# Patient Record
Sex: Female | Born: 1985 | Race: Black or African American | Hispanic: No | Marital: Single | Smoking: Former smoker
Health system: Southern US, Community
[De-identification: ages and names within clinical notes are randomized; demographics above are authoritative.]

## PROBLEM LIST (undated history)

## (undated) ENCOUNTER — Inpatient Hospital Stay (HOSPITAL_COMMUNITY): Payer: Self-pay

## (undated) ENCOUNTER — Inpatient Hospital Stay (HOSPITAL_COMMUNITY): Payer: Medicaid Other

## (undated) ENCOUNTER — Ambulatory Visit: Payer: Self-pay

## (undated) DIAGNOSIS — IMO0002 Reserved for concepts with insufficient information to code with codable children: Secondary | ICD-10-CM

## (undated) DIAGNOSIS — N83209 Unspecified ovarian cyst, unspecified side: Secondary | ICD-10-CM

## (undated) DIAGNOSIS — R87619 Unspecified abnormal cytological findings in specimens from cervix uteri: Secondary | ICD-10-CM

## (undated) DIAGNOSIS — F419 Anxiety disorder, unspecified: Secondary | ICD-10-CM

## (undated) DIAGNOSIS — B999 Unspecified infectious disease: Secondary | ICD-10-CM

## (undated) DIAGNOSIS — F4310 Post-traumatic stress disorder, unspecified: Secondary | ICD-10-CM

## (undated) DIAGNOSIS — F319 Bipolar disorder, unspecified: Secondary | ICD-10-CM

## (undated) HISTORY — PX: NO PAST SURGERIES: SHX2092

---
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
## 2008-06-07 ENCOUNTER — Emergency Department (HOSPITAL_COMMUNITY): Admission: EM | Admit: 2008-06-07 | Discharge: 2008-06-07 | Payer: Self-pay | Admitting: Emergency Medicine

## 2009-08-06 ENCOUNTER — Emergency Department (HOSPITAL_COMMUNITY): Admission: EM | Admit: 2009-08-06 | Discharge: 2009-08-06 | Payer: Self-pay | Admitting: Emergency Medicine

## 2009-08-12 ENCOUNTER — Emergency Department (HOSPITAL_COMMUNITY): Admission: EM | Admit: 2009-08-12 | Discharge: 2009-08-12 | Payer: Self-pay | Admitting: Emergency Medicine

## 2009-08-16 ENCOUNTER — Inpatient Hospital Stay (HOSPITAL_COMMUNITY): Admission: AD | Admit: 2009-08-16 | Discharge: 2009-08-17 | Payer: Self-pay | Admitting: Obstetrics and Gynecology

## 2009-08-16 ENCOUNTER — Ambulatory Visit: Payer: Self-pay | Admitting: Physician Assistant

## 2009-09-28 ENCOUNTER — Inpatient Hospital Stay (HOSPITAL_COMMUNITY): Admission: AD | Admit: 2009-09-28 | Discharge: 2009-09-28 | Payer: Self-pay | Admitting: Obstetrics & Gynecology

## 2009-10-16 ENCOUNTER — Inpatient Hospital Stay (HOSPITAL_COMMUNITY): Admission: AD | Admit: 2009-10-16 | Discharge: 2009-10-16 | Payer: Self-pay | Admitting: Obstetrics & Gynecology

## 2009-11-19 ENCOUNTER — Inpatient Hospital Stay (HOSPITAL_COMMUNITY): Admission: AD | Admit: 2009-11-19 | Discharge: 2009-11-19 | Payer: Self-pay | Admitting: Obstetrics & Gynecology

## 2010-01-17 ENCOUNTER — Inpatient Hospital Stay (HOSPITAL_COMMUNITY): Admission: AD | Admit: 2010-01-17 | Discharge: 2010-01-19 | Payer: Self-pay | Admitting: Obstetrics

## 2010-03-19 ENCOUNTER — Inpatient Hospital Stay (HOSPITAL_COMMUNITY): Admission: AD | Admit: 2010-03-19 | Discharge: 2010-03-19 | Payer: Self-pay | Admitting: Obstetrics

## 2010-03-20 ENCOUNTER — Inpatient Hospital Stay (HOSPITAL_COMMUNITY): Admission: AD | Admit: 2010-03-20 | Discharge: 2010-03-22 | Payer: Self-pay | Admitting: Obstetrics

## 2010-07-27 LAB — CBC
HCT: 36.8 % (ref 36.0–46.0)
MCH: 33.2 pg (ref 26.0–34.0)
MCH: 33.5 pg (ref 26.0–34.0)
MCHC: 35.1 g/dL (ref 30.0–36.0)
MCV: 94.4 fL (ref 78.0–100.0)
Platelets: 140 10*3/uL — ABNORMAL LOW (ref 150–400)
RBC: 3.05 MIL/uL — ABNORMAL LOW (ref 3.87–5.11)
RDW: 13.6 % (ref 11.5–15.5)
WBC: 9.7 10*3/uL (ref 4.0–10.5)

## 2010-07-29 LAB — URINALYSIS, ROUTINE W REFLEX MICROSCOPIC
Bilirubin Urine: NEGATIVE
Hgb urine dipstick: NEGATIVE
Nitrite: NEGATIVE
Specific Gravity, Urine: 1.02 (ref 1.005–1.030)

## 2010-07-29 LAB — WET PREP, GENITAL
Trich, Wet Prep: NONE SEEN
Yeast Wet Prep HPF POC: NONE SEEN

## 2010-07-29 LAB — URINE CULTURE: Special Requests: NEGATIVE

## 2010-07-29 LAB — GC/CHLAMYDIA PROBE AMP, GENITAL: Chlamydia, DNA Probe: NEGATIVE

## 2010-08-01 LAB — URINALYSIS, ROUTINE W REFLEX MICROSCOPIC
Bilirubin Urine: NEGATIVE
Glucose, UA: NEGATIVE mg/dL
Hgb urine dipstick: NEGATIVE
Nitrite: NEGATIVE
Protein, ur: NEGATIVE mg/dL
Specific Gravity, Urine: 1.02 (ref 1.005–1.030)
pH: 7 (ref 5.0–8.0)

## 2010-08-01 LAB — URINE CULTURE: Colony Count: 60000

## 2010-08-01 LAB — URINE MICROSCOPIC-ADD ON

## 2010-08-02 LAB — CBC
HCT: 34.4 % — ABNORMAL LOW (ref 36.0–46.0)
MCHC: 35.6 g/dL (ref 30.0–36.0)
MCV: 94.4 fL (ref 78.0–100.0)
Platelets: 155 10*3/uL (ref 150–400)
RDW: 14 % (ref 11.5–15.5)
WBC: 9.8 10*3/uL (ref 4.0–10.5)

## 2010-08-02 LAB — URINALYSIS, ROUTINE W REFLEX MICROSCOPIC
Bilirubin Urine: NEGATIVE
Glucose, UA: NEGATIVE mg/dL
Ketones, ur: NEGATIVE mg/dL
Ketones, ur: NEGATIVE mg/dL
Protein, ur: NEGATIVE mg/dL
Protein, ur: NEGATIVE mg/dL
Specific Gravity, Urine: 1.015 (ref 1.005–1.030)

## 2010-08-02 LAB — WET PREP, GENITAL
Clue Cells Wet Prep HPF POC: NONE SEEN
Trich, Wet Prep: NONE SEEN
Yeast Wet Prep HPF POC: NONE SEEN

## 2010-08-02 LAB — URINE CULTURE: Colony Count: NO GROWTH

## 2010-08-02 LAB — GC/CHLAMYDIA PROBE AMP, GENITAL
Chlamydia, DNA Probe: NEGATIVE
GC Probe Amp, Genital: NEGATIVE
GC Probe Amp, Genital: NEGATIVE

## 2010-08-02 LAB — URINE MICROSCOPIC-ADD ON

## 2010-08-04 LAB — URINE MICROSCOPIC-ADD ON

## 2010-08-04 LAB — URINALYSIS, ROUTINE W REFLEX MICROSCOPIC
Glucose, UA: NEGATIVE mg/dL
Protein, ur: 100 mg/dL — AB
pH: 6 (ref 5.0–8.0)

## 2010-08-06 LAB — URINALYSIS, ROUTINE W REFLEX MICROSCOPIC
Glucose, UA: NEGATIVE mg/dL
Nitrite: NEGATIVE
Specific Gravity, Urine: 1.023 (ref 1.005–1.030)
pH: 7.5 (ref 5.0–8.0)

## 2010-08-06 LAB — CBC
Hemoglobin: 12.8 g/dL (ref 12.0–15.0)
MCHC: 34.3 g/dL (ref 30.0–36.0)
MCV: 95 fL (ref 78.0–100.0)
RBC: 3.93 MIL/uL (ref 3.87–5.11)
WBC: 8.6 10*3/uL (ref 4.0–10.5)

## 2010-08-06 LAB — DIFFERENTIAL
Basophils Relative: 0 % (ref 0–1)
Eosinophils Absolute: 0.1 10*3/uL (ref 0.0–0.7)
Lymphs Abs: 1.8 10*3/uL (ref 0.7–4.0)
Monocytes Absolute: 0.6 10*3/uL (ref 0.1–1.0)
Monocytes Relative: 7 % (ref 3–12)
Neutro Abs: 6 10*3/uL (ref 1.7–7.7)
Neutrophils Relative %: 71 % (ref 43–77)

## 2010-08-06 LAB — POCT I-STAT, CHEM 8
BUN: 7 mg/dL (ref 6–23)
Creatinine, Ser: 0.8 mg/dL (ref 0.4–1.2)
Glucose, Bld: 77 mg/dL (ref 70–99)
Hemoglobin: 12.9 g/dL (ref 12.0–15.0)
Potassium: 4.2 mEq/L (ref 3.5–5.1)
Sodium: 138 mEq/L (ref 135–145)

## 2010-08-06 LAB — GC/CHLAMYDIA PROBE AMP, GENITAL
Chlamydia, DNA Probe: NEGATIVE
GC Probe Amp, Genital: NEGATIVE

## 2010-08-06 LAB — WET PREP, GENITAL

## 2010-08-06 LAB — PREGNANCY, URINE: Preg Test, Ur: POSITIVE

## 2010-08-06 LAB — URINE MICROSCOPIC-ADD ON

## 2010-08-06 LAB — POCT PREGNANCY, URINE: Preg Test, Ur: POSITIVE

## 2010-08-06 LAB — URINE CULTURE: Colony Count: NO GROWTH

## 2010-08-06 LAB — ABO/RH: ABO/RH(D): B POS

## 2010-08-30 LAB — COMPREHENSIVE METABOLIC PANEL
ALT: 17 U/L (ref 0–35)
CO2: 26 mEq/L (ref 19–32)
Calcium: 9.4 mg/dL (ref 8.4–10.5)
Chloride: 106 mEq/L (ref 96–112)
Creatinine, Ser: 0.87 mg/dL (ref 0.4–1.2)
GFR calc non Af Amer: >60 mL/min (ref >60)
Glucose, Bld: 86 mg/dL (ref 70–99)
Sodium: 139 mEq/L (ref 135–145)
Total Bilirubin: 0.8 mg/dL (ref 0.3–1.2)

## 2010-08-30 LAB — POCT PREGNANCY, URINE: Preg Test, Ur: NEGATIVE

## 2010-08-30 LAB — RAPID URINE DRUG SCREEN, HOSP PERFORMED
Barbiturates: NOT DETECTED
Benzodiazepines: NOT DETECTED
Cocaine: NOT DETECTED
Opiates: NOT DETECTED

## 2010-08-30 LAB — DIFFERENTIAL
Basophils Absolute: 0 10*3/uL (ref 0.0–0.1)
Eosinophils Absolute: 0.2 10*3/uL (ref 0.0–0.7)
Lymphs Abs: 1.7 10*3/uL (ref 0.7–4.0)
Neutrophils Relative %: 73 % (ref 43–77)

## 2010-08-30 LAB — URINALYSIS, ROUTINE W REFLEX MICROSCOPIC
Bilirubin Urine: NEGATIVE
Hgb urine dipstick: NEGATIVE
Ketones, ur: NEGATIVE mg/dL
Nitrite: NEGATIVE
Protein, ur: NEGATIVE mg/dL
Urobilinogen, UA: 1 mg/dL (ref 0.0–1.0)

## 2010-08-30 LAB — CBC
MCHC: 33.8 g/dL (ref 30.0–36.0)
RBC: 4.78 MIL/uL (ref 3.87–5.11)

## 2010-08-30 LAB — GC/CHLAMYDIA PROBE AMP, GENITAL
Chlamydia, DNA Probe: NEGATIVE
GC Probe Amp, Genital: NEGATIVE

## 2010-08-30 LAB — WET PREP, GENITAL: Clue Cells Wet Prep HPF POC: NONE SEEN

## 2010-08-30 LAB — TRICYCLICS SCREEN, URINE: TCA Scrn: NOT DETECTED

## 2010-08-30 LAB — URINE CULTURE

## 2010-08-30 LAB — ETHANOL: Alcohol, Ethyl (B): <5 mg/dL (ref 0–10)

## 2010-09-15 ENCOUNTER — Inpatient Hospital Stay (INDEPENDENT_AMBULATORY_CARE_PROVIDER_SITE_OTHER)
Admission: RE | Admit: 2010-09-15 | Discharge: 2010-09-15 | Disposition: A | Discharge status: Home / Self Care | Payer: Medicaid Other | Source: Ambulatory Visit | Attending: Family Medicine | Admitting: Family Medicine

## 2010-09-15 DIAGNOSIS — N949 Unspecified condition associated with female genital organs and menstrual cycle: Secondary | ICD-10-CM

## 2010-09-15 DIAGNOSIS — N912 Amenorrhea, unspecified: Secondary | ICD-10-CM

## 2010-09-15 LAB — POCT URINALYSIS DIP (DEVICE)
Bilirubin Urine: NEGATIVE
Hgb urine dipstick: NEGATIVE
Nitrite: NEGATIVE
Protein, ur: NEGATIVE mg/dL
Urobilinogen, UA: 0.2 mg/dL (ref 0.0–1.0)
pH: 5.5 (ref 5.0–8.0)

## 2011-05-17 NOTE — L&D Delivery Note (Signed)
Delivery Note At 12:09 AM a viable female was delivered via Vaginal, Spontaneous Delivery (Presentation: Middle Occiput Anterior).  APGAR: 8, 9; weight 6 lb 4.7 oz (2855 g).   Placenta status: , Manual removal.  Cord:  with the following complications: None.  Cord pH: none  Anesthesia: Epidural  Episiotomy: None Lacerations: None Suture Repair: none Est. Blood Loss (mL): 500  Mom to postpartum.  Baby to nursery-stable.  HARPER,CHARLES A 04/15/2012, 12:57 AM

## 2011-09-05 ENCOUNTER — Inpatient Hospital Stay (HOSPITAL_COMMUNITY): Payer: Medicaid Other

## 2011-09-05 ENCOUNTER — Inpatient Hospital Stay (HOSPITAL_COMMUNITY)
Admission: AD | Admit: 2011-09-05 | Discharge: 2011-09-05 | Disposition: A | Discharge status: Home / Self Care | Payer: Medicaid Other | Source: Ambulatory Visit | Attending: Obstetrics and Gynecology | Admitting: Obstetrics and Gynecology

## 2011-09-05 ENCOUNTER — Encounter (HOSPITAL_COMMUNITY): Payer: Self-pay | Admitting: *Deleted

## 2011-09-05 DIAGNOSIS — O209 Hemorrhage in early pregnancy, unspecified: Secondary | ICD-10-CM | POA: Insufficient documentation

## 2011-09-05 DIAGNOSIS — O4690 Antepartum hemorrhage, unspecified, unspecified trimester: Secondary | ICD-10-CM

## 2011-09-05 LAB — HCG, QUANTITATIVE, PREGNANCY: hCG, Beta Chain, Quant, S: 111785 m[IU]/mL — ABNORMAL HIGH (ref <5)

## 2011-09-05 LAB — WET PREP, GENITAL: Yeast Wet Prep HPF POC: NONE SEEN

## 2011-09-05 NOTE — MAU Provider Note (Signed)
History     CSN: [id number]  Arrival date and time: 09/05/11 1730   None     Chief Complaint  Patient presents with  . Vaginal Bleeding   HPI Meghan Salazar is 26 y.o. G2P1001 [redacted]w[redacted]d weeks presenting with report of pink on the tissue when she wiped today at work.  LMP 06/23/11-normal for her.   Plans care with Dr. Gaynell Face when [facility] card comes.  Denies abdominal pain but does have mild cramps. Hx of ovarian cysts.  Had bleeding with first pregnancy.      Past Medical History  Diagnosis Date  . Asthma     No past surgical history on file.  Family History  Problem Relation Age of Onset  . Diabetes Maternal Grandmother   . Hypertension Paternal Grandmother     History  Substance Use Topics  . Smoking status: Former Games developer  . Smokeless tobacco: Not on file  . Alcohol Use: No    Allergies: No Known Allergies  Prescriptions prior to admission  Medication Sig Dispense Refill  . albuterol (PROVENTIL) (2.5 MG/3ML) 0.083% nebulizer solution Take 2.5 mg by nebulization every 6 (six) hours as needed. For asthma      . Prenatal Vit-Fe Fumarate-FA (PRENATAL MULTIVITAMIN) TABS Take 1 tablet by mouth every morning.        Review of Systems  Gastrointestinal: Positive for abdominal pain (cramping). Negative for nausea and vomiting.  Genitourinary:       + for vaginal spotting   Physical Exam   Blood pressure 128/75, pulse 62, temperature 98 F (36.7 C), temperature source Oral, resp. rate 18, height 5\' 7"  (1.702 m), weight 81.647 kg (180 lb), last menstrual period 06/23/2011.  Physical Exam  Constitutional: She is oriented to person, place, and time. She appears well-developed and well-nourished. No distress.  HENT:  Head: Normocephalic.  Cardiovascular: Normal rate.   Respiratory: Effort normal.  Genitourinary: Uterus is enlarged. Uterus is not tender. Right adnexum displays no mass, no tenderness and no fullness. Left adnexum displays no mass, no tenderness and no  fullness. There is bleeding (small amount of pink chalky appearing discharge with mild odor.  No clots seen) around the vagina.  Neurological: She is alert and oriented to person, place, and time.  Skin: Skin is warm and dry.  Psychiatric: She has a normal mood and affect. Her behavior is normal.   BLOOD TYPE FROM PREVIOUS RECORD IS B+  Results for orders placed during the hospital encounter of 09/05/11 (from the past 24 hour(s))  POCT PREGNANCY, URINE     Status: Abnormal   Collection Time   09/05/11  5:54 PM      Component Value Range   Preg Test, Ur POSITIVE (*) NEGATIVE   WET PREP, GENITAL     Status: Abnormal   Collection Time   09/05/11  6:50 PM      Component Value Range   Yeast Wet Prep HPF POC NONE SEEN  NONE SEEN    Trich, Wet Prep NONE SEEN  NONE SEEN    Clue Cells Wet Prep HPF POC FEW (*) NONE SEEN    WBC, Wet Prep HPF POC MANY (*) NONE SEEN   HCG, QUANTITATIVE, PREGNANCY     Status: Abnormal   Collection Time   09/05/11  7:00 PM      Component Value Range   hCG, Beta Chain, Quant, S 409811 (*) <5 (mIU/mL)   ULTRASOUND REPORT:  IUGS with YS, FP, and Cardiac activity of 149.  Right CLC, no FF [redacted]w[redacted]d with EDD 04/18/12.   MAU Course  Procedures GC/CHL culture to lab  MDM   Assessment and Plan  A:  Bleeding in early pregnancy      Viable intrauterine pregnancy at [redacted]w[redacted]d  P:  Pelvic rest      Begin prenatal care with the doctor of your choice.   Varnika Butz,EVE M 09/05/2011, 6:34 PM

## 2011-09-05 NOTE — MAU Note (Signed)
Pt is unsure of dates of last period; dopplered for a FHR but unable to hear one; c/o pinkish discharge on tissue when she wipes;c/o slight cramping;

## 2011-09-05 NOTE — Discharge Instructions (Signed)
Pelvic Rest Pelvic rest is sometimes recommended for women when:   The placenta is partially or completely covering the opening of the cervix (placenta previa).   There is bleeding between the uterine wall and the amniotic sac in the first trimester (subchorionic hemorrhage).   The cervix begins to open without labor starting (incompetent cervix, cervical insufficiency).   The labor is too early (preterm labor).  HOME CARE INSTRUCTIONS  Do not have sexual intercourse, stimulation, or an orgasm.   Do not use tampons, douche, or put anything in the vagina.   Do not lift anything over 10 pounds (4.5 kg).   Avoid strenuous activity or straining your pelvic muscles.  SEEK MEDICAL CARE IF:  You have any vaginal bleeding during pregnancy. Treat this as a potential emergency.   You have cramping pain felt low in the stomach (stronger than menstrual cramps).   You notice vaginal discharge (watery, mucus, or bloody).   You have a low, dull backache.   There are regular contractions or uterine tightening.  SEEK IMMEDIATE MEDICAL CARE IF: You have vaginal bleeding and have placenta previa.  Document Released: 08/27/2010 Document Revised: 04/21/2011 Document Reviewed: 08/27/2010 [facility] Patient Information 2012 [place], [place].Vaginal Bleeding During Pregnancy, Second Trimester A small amount of bleeding (spotting) is relatively common in pregnancy. It usually stops on its own. There are many causes for bleeding or spotting in pregnancy. Some bleeding may be related to the pregnancy and some may not. Cramping with the bleeding is more serious and concerning. Tell your caregiver if you have any vaginal bleeding.  CAUSES   Infection, inflammation or growths on the cervix.   The placenta may partially or completely be covering the opening of the cervix inside the uterus.   The placenta may have separated from the uterus.   You may be having early/preterm labor.   The cervix is not  strong enough to keep a baby inside the uterus (cervical insufficiency).   Many tiny cysts in the uterus instead of pregnancy tissue (molar pregnancy)  SYMPTOMS   Vaginal spotting or bleeding with or without cramps.   Uterine contractions.   Abnormal vaginal discharge.   You may have spotting or spotting after having sexual intercourse.  DIAGNOSIS  To evaluate the pregnancy, your caregiver may:  Do a pelvic exam.   Take blood tests.   Do an ultrasound.  It is very important to follow your caregiver's instructions.  TREATMENT   Evaluation of the pregnancy with blood tests and ultrasound.   Bed rest (getting up to use the bathroom only).   Rho-gam immunization if the mother is Rh negative and the father is Rh positive.   If you are having uterine contractions, you may be given medication to stop the contractions.   If you have cervical insufficiency, you may have a suture placed in the cervix to close it.  HOME CARE INSTRUCTIONS   If your caregiver orders bed rest, you may need to make arrangements for the care of other children and for any other responsibilities. However, your caregiver may allow you to continue light activity.   Keep track of the number of pads you use each day and how soaked (saturated) they are. Write this down.   Do not use tampons. Do not douche.   Do not have sexual intercourse or orgasms until approved by your physician.   Save any tissue that you pass for your caregiver to see.   Take medicine for cramps only with your caregiver's permission.  Do not take aspirin because it can make you bleed.   Do not exercise, do any strenuous activities or heavy lifting without your caregiver's permission.  SEEK IMMEDIATE MEDICAL CARE IF:   You experience severe cramps in your stomach, back or belly (abdomen).   You have uterine contractions.   You have an oral temperature above 102 F (38.9 C), not controlled by medicine.   You develop chills.    You pass large clots or tissue.   Your bleeding increases or you become light-headed, weak or have fainting episodes.   You have leaking or a gush of fluid from your vagina.  Document Released: 02/09/2005 Document Revised: 04/21/2011 Document Reviewed: 08/21/2008 [employer] Patient Information Vaginal Bleeding During Pregnancy A small amount of bleeding from the vagina can happen anytime during pregnancy. Be sure to tell your doctor about all vaginal bleeding.  HOME CARE  Get plenty of rest and sleep.   Count the number of pads you use each day. Do not use tampons.   Save any tissue you pass for your doctor to see.   Do not exercise   Do not do any heavy lifting.   Avoid going up and down stairs. If you must climb stairs, go slowly.   Do not have sex (intercourse) or orgasms until approved by your doctor.   Do not douche.   Only take medicine as told by your doctor. Do not take aspirin.   Eat healthy.   Always keep your follow-up appointments.  GET HELP RIGHT AWAY IF:   You feel the baby moving less or not moving at all.   The bleeding gets worse.   You have very painful cramps or pain in your stomach or back.   You pass large clots or anything that looks like tissue.   You have a temperature by mouth above 102 F (38.9 C).   You feel very weak.   You have chills.   You feel dizzy or pass out (faint).   You have a gush of fluid from the vagina.  MAKE SURE YOU:   Understand these instructions.   Will watch your condition.   Will get help right away if you are not doing well or get worse.  Document Released: 02/09/2008 Document Revised: 04/21/2011 Document Reviewed: 04/07/2009 [facility] Patient Information 2012 [place], [place].Vaginal Bleeding During Pregnancy A small amount of bleeding from the vagina can happen anytime during pregnancy. Be sure to tell your doctor about all vaginal bleeding.  HOME CARE  Get plenty of rest and sleep.   Count the  number of pads you use each day. Do not use tampons.   Save any tissue you pass for your doctor to see.   Do not exercise   Do not do any heavy lifting.   Avoid going up and down stairs. If you must climb stairs, go slowly.   Do not have sex (intercourse) or orgasms until approved by your doctor.   Do not douche.   Only take medicine as told by your doctor. Do not take aspirin.   Eat healthy.   Always keep your follow-up appointments.  GET HELP RIGHT AWAY IF:   You feel the baby moving less or not moving at all.   The bleeding gets worse.   You have very painful cramps or pain in your stomach or back.   You pass large clots or anything that looks like tissue.   You have a temperature by mouth above 102 F (38.9 C).  You feel very weak.   You have chills.   You feel dizzy or pass out (faint).   You have a gush of fluid from the vagina.  MAKE SURE YOU:   Understand these instructions.   Will watch your condition.   Will get help right away if you are not doing well or get worse.  Document Released: 02/09/2008 Document Revised: 04/21/2011 Document Reviewed: 04/07/2009 [facility] Patient Information 2012 [place], [place].

## 2011-09-05 NOTE — MAU Note (Signed)
Pink noted when wiped.  Got scared.  Has been having cramps in lower abd.  preg test at  A few wks ago, confirmed at health dept- was told [redacted]w[redacted]d.

## 2011-09-06 LAB — GC/CHLAMYDIA PROBE AMP, GENITAL
Chlamydia, DNA Probe: NEGATIVE
GC Probe Amp, Genital: NEGATIVE

## 2011-09-06 NOTE — MAU Provider Note (Signed)
Agree with above note.  Meghan Salazar 09/06/2011 7:04 AM

## 2011-09-26 ENCOUNTER — Inpatient Hospital Stay (HOSPITAL_COMMUNITY)
Admission: AD | Admit: 2011-09-26 | Discharge: 2011-09-26 | Disposition: A | Discharge status: Hospice | Payer: Medicaid Other | Source: Ambulatory Visit | Attending: Obstetrics & Gynecology | Admitting: Obstetrics & Gynecology

## 2011-09-26 ENCOUNTER — Encounter (HOSPITAL_COMMUNITY): Payer: Self-pay | Admitting: *Deleted

## 2011-09-26 DIAGNOSIS — O210 Mild hyperemesis gravidarum: Secondary | ICD-10-CM | POA: Insufficient documentation

## 2011-09-26 DIAGNOSIS — O219 Vomiting of pregnancy, unspecified: Secondary | ICD-10-CM

## 2011-09-26 HISTORY — DX: Unspecified ovarian cyst, unspecified side: N83.209

## 2011-09-26 LAB — URINALYSIS, ROUTINE W REFLEX MICROSCOPIC
Ketones, ur: 15 mg/dL — AB
Leukocytes, UA: NEGATIVE
Protein, ur: NEGATIVE mg/dL
Urobilinogen, UA: 0.2 mg/dL (ref 0.0–1.0)

## 2011-09-26 LAB — URINE MICROSCOPIC-ADD ON

## 2011-09-26 MED ORDER — ONDANSETRON HCL 4 MG PO TABS
8.0000 mg | ORAL_TABLET | Freq: Once | ORAL | Status: AC
  Administered 2011-09-26: 8 mg via ORAL
  Filled 2011-09-26: qty 2

## 2011-09-26 MED ORDER — ONDANSETRON HCL 8 MG PO TABS: 8.0000 mg | ORAL_TABLET | Freq: Three times a day (TID) | ORAL | Status: AC | PRN

## 2011-09-26 NOTE — MAU Note (Signed)
Pt has been vomiting since early pregnancy, but says it has been getting worse over the past two days.  Denies any diarrhea or fever, dysuria, or bleeding.  States she  Cannot keep anything down.

## 2011-09-26 NOTE — MAU Provider Note (Signed)
History     CSN: [id number]  Arrival date and time: 09/26/11 1542   None     Chief Complaint  Patient presents with  . Hyperemesis Gravidarum   HPI  Pt is here with report of nausea and vomiting of pregnancy.  Reports unable to hold anything down for 24 hours.  Denies fever, body aches, or chill.  No vaginal bleeding or abnormal vaginal discharge.   Past Medical History  Diagnosis Date  . Asthma   . Other and unspecified ovarian cysts     History reviewed. No pertinent past surgical history.  Family History  Problem Relation Age of Onset  . Diabetes Maternal Grandmother   . Hypertension Paternal Grandmother     History  Substance Use Topics  . Smoking status: Former Smoker -- 0.2 packs/day    Types: Cigarettes    Quit date: 07/27/2011  . Smokeless tobacco: Not on file  . Alcohol Use: No    Allergies: No Known Allergies  Prescriptions prior to admission  Medication Sig Dispense Refill  . albuterol (PROVENTIL) (2.5 MG/3ML) 0.083% nebulizer solution Take 2.5 mg by nebulization every 6 (six) hours as needed. For asthma      . Prenatal Vit-Fe Fumarate-FA (PRENATAL MULTIVITAMIN) TABS Take 1 tablet by mouth every morning.        Review of Systems  Gastrointestinal: Positive for nausea and vomiting. Negative for abdominal pain.  All other systems reviewed and are negative.   Physical Exam   Blood pressure 118/66, pulse 52, temperature 97.5 F (36.4 C), temperature source Oral, resp. rate 16, height 5\' 7"  (1.702 m), weight 78.472 kg (173 lb), last menstrual period 06/23/2011, unknown if currently breastfeeding.  Physical Exam  Constitutional: She is oriented to person, place, and time. She appears well-developed and well-nourished. No distress.       Laughs during visit  HENT:  Head: Normocephalic.  Mouth/Throat: Mucous membranes are not dry.  Neck: Normal range of motion. Neck supple.  Cardiovascular: Normal rate, regular rhythm and normal heart sounds.     Respiratory: Effort normal and breath sounds normal.  GI: Soft. There is no tenderness.  Genitourinary: No bleeding around the vagina.  Musculoskeletal: Normal range of motion.  Neurological: She is alert and oriented to person, place, and time.  Skin: Skin is warm and dry.    MAU Course  Procedures  Results for orders placed during the hospital encounter of 09/26/11 (from the past 24 hour(s))  URINALYSIS, ROUTINE W REFLEX MICROSCOPIC     Status: Abnormal   Collection Time   09/26/11  3:55 PM      Component Value Range   Color, Urine YELLOW  YELLOW    APPearance CLOUDY (*) CLEAR    Specific Gravity, Urine >1.030 (*) 1.005 - 1.030    pH 6.0  5.0 - 8.0    Glucose, UA NEGATIVE  NEGATIVE (mg/dL)   Hgb urine dipstick TRACE (*) NEGATIVE    Bilirubin Urine NEGATIVE  NEGATIVE    Ketones, ur 15 (*) NEGATIVE (mg/dL)   Protein, ur NEGATIVE  NEGATIVE (mg/dL)   Urobilinogen, UA 0.2  0.0 - 1.0 (mg/dL)   Nitrite NEGATIVE  NEGATIVE    Leukocytes, UA NEGATIVE  NEGATIVE   URINE MICROSCOPIC-ADD ON     Status: Abnormal   Collection Time   09/26/11  3:55 PM      Component Value Range   Squamous Epithelial / LPF MANY (*) RARE    WBC, UA 3-6  <3 (WBC/hpf)  RBC / HPF 0-2  <3 (RBC/hpf)   Bacteria, UA FEW (*) RARE    Urine-Other MUCOUS PRESENT     PO Zofran > reports improvement in symptoms  Assessment and Plan  Vomiting in Pregnancy  Plan: DC to home RX Zofran Keep scheduled appointment with Dr. Gaynell Face  [facility] 09/26/2011, 4:27 PM

## 2011-09-27 NOTE — MAU Provider Note (Signed)
Attestation of Attending Supervision of Advanced Practitioner: Evaluation and management procedures were performed by the OB Fellow/PA/CNM/NP under my supervision and collaboration. Chart reviewed, and agree with management and plan.  Jennel Mara, M.D. 09/27/2011 9:34 AM   

## 2011-10-02 ENCOUNTER — Inpatient Hospital Stay (HOSPITAL_COMMUNITY)
Admission: AD | Admit: 2011-10-02 | Discharge: 2011-10-02 | Disposition: A | Discharge status: Home / Self Care | Payer: Medicaid Other | Source: Ambulatory Visit | Attending: Obstetrics and Gynecology | Admitting: Obstetrics and Gynecology

## 2011-10-02 DIAGNOSIS — O99891 Other specified diseases and conditions complicating pregnancy: Secondary | ICD-10-CM | POA: Insufficient documentation

## 2011-10-02 DIAGNOSIS — W108XXA Fall (on) (from) other stairs and steps, initial encounter: Secondary | ICD-10-CM | POA: Insufficient documentation

## 2011-10-02 DIAGNOSIS — S0510XA Contusion of eyeball and orbital tissues, unspecified eye, initial encounter: Secondary | ICD-10-CM

## 2011-10-02 DIAGNOSIS — Z331 Pregnant state, incidental: Secondary | ICD-10-CM

## 2011-10-02 DIAGNOSIS — S0592XA Unspecified injury of left eye and orbit, initial encounter: Secondary | ICD-10-CM

## 2011-10-02 DIAGNOSIS — S0010XA Contusion of unspecified eyelid and periocular area, initial encounter: Secondary | ICD-10-CM | POA: Insufficient documentation

## 2011-10-02 DIAGNOSIS — W19XXXA Unspecified fall, initial encounter: Secondary | ICD-10-CM

## 2011-10-02 LAB — URINALYSIS, ROUTINE W REFLEX MICROSCOPIC
Bilirubin Urine: NEGATIVE
Hgb urine dipstick: NEGATIVE
Nitrite: NEGATIVE
Specific Gravity, Urine: >1.03 — ABNORMAL HIGH (ref 1.005–1.030)
Urobilinogen, UA: 0.2 mg/dL (ref 0.0–1.0)
pH: 6 (ref 5.0–8.0)

## 2011-10-02 MED ORDER — CYCLOBENZAPRINE HCL 10 MG PO TABS: 10.0000 mg | ORAL_TABLET | Freq: Once | ORAL | Status: AC

## 2011-10-02 MED ORDER — CYCLOBENZAPRINE HCL 10 MG PO TABS
10.0000 mg | ORAL_TABLET | Freq: Once | ORAL | Status: AC
  Administered 2011-10-02: 10 mg via ORAL
  Filled 2011-10-02: qty 1

## 2011-10-02 NOTE — Discharge Instructions (Signed)
RESOURCE GUIDE  Dental Problems  Patients with Medicaid: [facility]                     [address]                                           Phone:  [phone]                                                  If unable to pay or uninsured, contact:  [facility] or [facility] to become qualified for the adult dental clinic.  Chronic Pain Problems Contact [facility]  [phone] Patients need to be referred by their primary care doctor.  Insufficient Money for Medicine Contact [employer] Way:  call "211" or [facility] 271-5999.  No Primary Care Doctor Call [facility]  832-8000 Other agencies that provide inexpensive medical care    [facility]  [phone]    [facility]  [phone]    [facility]  [phone]    [facility]  [phone]    [facility]  [phone]    [facility]  [phone]  Substance Abuse Resources [facility]  336-882-2125 [facility] 336-784-9470 [facility] 336-285-9073 [facility] 336-845-3988 Residential & Outpatient Substance Abuse Program  800-659-3381  Psychological Services [facility]  832-9600 [facility]  [phone] [facility]   800 853-5163 (emergency services 641-4993)  Abuse/Neglect [place] Child Abuse Hotline (336) 641-3795 [place] Child Abuse Hotline 800-378-5315 (After Hours)  Emergency Shelter [facility] (336) 271-5985  [facility] (336) 275-9566 [facility] (704) 372-4663  MRSA Hotline #:   832-7006    [place] [facility]  [facility] [facility]  [employer]                           [facility] [address] [place]                     [address]         [address]  [place]                                                [place]                              [place] Phone:  [phone]                                  Phone:  [phone]                   Phone:  [phone]  [facility] Phone:  342-8316  [place] Child Abuse Hotline (336) 342-1394 (336)   409-8119 (After Hours)If You Are the Victim of Domestic Violence THE POLICE CAN HELP YOU: Get to a safe place away from the violence.  Get information on how the court can help protect you against the violence.  Get medical care for injuries you or your children may have.  Get necessary belongings from your home for you and your children.  Get copies of police reports about the violence.  File a complaint in criminal court.  Find where local criminal and family courts are located.  THE COURTS CAN HELP YOU If the person who harmed or threatened you is a family member or someone you have had a child with, then you have the right to take your case to the criminal courts, the [employer], or both.  If you and the abuser are not related, were not ever married, and do not have a child in common, then your case can be heard only in the criminal court.  The forms you need are available from the [facility] and the criminal court.  The courts can decide to provide a temporary order of protection for:  You.  Your children.  Any witnesses who may request one.  The [employer] may appoint a lawyer to help you in court if it is found that you cannot afford one.  The Family Court may order temporary child support and temporary custody of your children.  LAWS VARY FROM STATE TO STATE. YOU WILL NEED TO CHECK THE LAWS IN YOUR STATE. You may request that the law enforcement officer assist in:  Providing for your safety and that of your children. This includes providing information on how to obtain a temporary order of protection.  Obtaining essential personal property.  Locating and taking you and your children to a  safe place within the officer's jurisdiction. This includes but is not limited to a domestic violence program, a family member's or a friend's residence, or a similar place of safety.  Obtaining medical treatment for you and your children.  When the officer's jurisdiction is more than a single county, you may ask the officer to take you or make arrangements to take you and your children to a place of safety in the county where the incident occurred.  You may request a copy of any incident reports at no cost from the law enforcement agency.  You have the right to seek legal counsel of your own choosing. If you proceed in family court and if it is determined that you cannot afford an attorney one must be appointed to represent you without cost to you.  You may ask the district attorney or a [employer] to file a criminal complaint. You also have the right to have your petition and request for an order of protection filed on the same day you appear in court. Such request must be heard that same day or the next day court is in session.  Either court may issue an order of protection from conduct constituting a family offense. This could include an order for the respondent or defendant to stay away from you and your children.  If the family court is not in session, you may seek immediate assistance from the criminal court in obtaining an order of protection. The forms you need to obtain an order of protection are available from the family court and the local criminal court. Note that filing a criminal complaint or a family court petition containing allegations (claims) that are knowingly false is  a crime.  Call your local domestic violence program for additional information and support. Document Released: 07/23/2003 Document Revised: 04/21/2011 Document Reviewed: 03/12/2007 [facility] Patient Information 2012 [place], [place].Eye Contusion A black eye can happen when the eye area is hit with force.  Bleeding can happen under the skin of the eyelids. This causes the lids to puff up (swell) and look red and then black. This is often called a "black eye." If the injury involves only the lids and the surface of the eye, the black area will turn greenish and then yellowish in color before going away. The swelling may also go under the clear membrane that covers the white part of the eyeball making it very thick and red. If the black eye only involves the lids and tissues around the eye, the injured area will get better within a few days to weeks. However, black eyes can be serious and affect the eyeball and sight. CAUSES   Blunt injury or trauma to the face or eye area.   A forehead injury when blood under the skin works its way down to the eyelids.   Rubbing the eyes due to irritation (like allergies).   Blood clotting diseases.   Other serious eye or eye socket (orbit) diseases (such as a tumor, blood clot, or infection behind the eye).  SYMPTOMS   Swelling and redness of the eyelids.   Eyelid area turns to a black color.   Tenderness or soreness of the eyelids.  DIAGNOSIS  A diagnosis is usually based upon a thorough examination of the eye and surrounding area. The eye must be looked at carefully to be sure that it is not injured or that nothing else can threaten vision. An X-ray or CT scan may be needed to determine if there were any associated injuries, such as fractures. HOME CARE INSTRUCTIONS   If it is determined that there is no injury to the eye, you may continue normal activities.   Sunglasses may be worn to protect your eyes from bright light if this is uncomfortable.   Apply ice to the injury for 15 to 20 minutes, 3 to 4 times per day for swelling during the first 24 to 48 hours. To do this:   Put the ice in a plastic bag.   Place a towel between the bag of ice and your skin.   Sleep with your head high (elevated). You can put an extra pillow under your head. This may  help with discomfort.   Only take over-the-counter or prescription medicines for pain, discomfort, or fever as directed by your caregiver.   Do not use aspirin as this can increase bleeding.  If there is an injury to the eye, treatment will be determined by the nature of the injury. SEEK IMMEDIATE MEDICAL CARE IF:   You have any form of visual loss in the eye on the side of the injury.   You have double vision. You see 2 of everything (diplopia).   You feel sick to your stomach, dizzy, sleepy, or faint.   You develop any discharge from the eye or your nose.   The swelling and discoloration does not begin to go away in a few days.  MAKE SURE YOU:   Understand these instructions.   Will watch your condition.   Will get help right away if you are not doing well or get worse.  Document Released: 04/29/2000 Document Revised: 04/21/2011 Document Reviewed: 03/18/2011 [facility] Patient Information 2012 [place], [place].
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
## 2011-10-03 NOTE — MAU Provider Note (Signed)
Agree with above note.  Meghan Salazar 10/03/2011 6:32 AM

## 2011-10-31 ENCOUNTER — Other Ambulatory Visit (HOSPITAL_COMMUNITY): Payer: Self-pay | Admitting: Obstetrics

## 2011-10-31 DIAGNOSIS — O26879 Cervical shortening, unspecified trimester: Secondary | ICD-10-CM

## 2011-10-31 DIAGNOSIS — Z3689 Encounter for other specified antenatal screening: Secondary | ICD-10-CM

## 2011-11-01 ENCOUNTER — Other Ambulatory Visit (HOSPITAL_COMMUNITY): Payer: Self-pay | Admitting: Obstetrics

## 2011-11-01 DIAGNOSIS — O26879 Cervical shortening, unspecified trimester: Secondary | ICD-10-CM

## 2011-11-01 DIAGNOSIS — Z3689 Encounter for other specified antenatal screening: Secondary | ICD-10-CM

## 2011-11-02 ENCOUNTER — Encounter (HOSPITAL_COMMUNITY): Payer: Self-pay

## 2011-11-02 ENCOUNTER — Ambulatory Visit (HOSPITAL_COMMUNITY)
Admission: RE | Admit: 2011-11-02 | Discharge: 2011-11-02 | Disposition: A | Discharge status: Home / Self Care | Payer: Medicaid Other | Source: Ambulatory Visit | Attending: Obstetrics | Admitting: Obstetrics

## 2011-11-02 VITALS — BP 117/41 | HR 56 | Wt 176.2 lb

## 2011-11-02 DIAGNOSIS — O09299 Supervision of pregnancy with other poor reproductive or obstetric history, unspecified trimester: Secondary | ICD-10-CM | POA: Insufficient documentation

## 2011-11-02 DIAGNOSIS — Z3689 Encounter for other specified antenatal screening: Secondary | ICD-10-CM | POA: Insufficient documentation

## 2011-11-02 DIAGNOSIS — O26879 Cervical shortening, unspecified trimester: Secondary | ICD-10-CM

## 2011-11-02 NOTE — Progress Notes (Signed)
Patient seen today  for ultrasound appointment.  See full report in [facility].  Alpha Gula, MD  Single IUP at 16 0/7 weeks Hx of short cervix in previous delivery The cerivical length today is 3.3 cm without funneling or dynamic changes. Normal amniotic fluid volume  Recommend follow up ultrasound in 2-3 weeks for anatomy and cervical length.

## 2011-11-16 ENCOUNTER — Other Ambulatory Visit (HOSPITAL_COMMUNITY): Payer: Self-pay

## 2011-11-23 ENCOUNTER — Ambulatory Visit (HOSPITAL_COMMUNITY): Payer: Medicaid Other

## 2011-12-02 ENCOUNTER — Ambulatory Visit (HOSPITAL_COMMUNITY): Admission: RE | Admit: 2011-12-02 | Payer: Medicaid Other | Source: Ambulatory Visit

## 2012-01-06 LAB — OB RESULTS CONSOLE ANTIBODY SCREEN: Antibody Screen: NEGATIVE

## 2012-01-06 LAB — OB RESULTS CONSOLE HEPATITIS B SURFACE ANTIGEN: Hepatitis B Surface Ag: NEGATIVE

## 2012-01-12 ENCOUNTER — Encounter (HOSPITAL_COMMUNITY): Payer: Self-pay | Admitting: *Deleted

## 2012-01-12 ENCOUNTER — Inpatient Hospital Stay (HOSPITAL_COMMUNITY)
Admission: AD | Admit: 2012-01-12 | Discharge: 2012-01-12 | Disposition: A | Discharge status: Home / Self Care | Payer: Medicaid Other | Source: Ambulatory Visit | Attending: Obstetrics | Admitting: Obstetrics

## 2012-01-12 DIAGNOSIS — M5380 Other specified dorsopathies, site unspecified: Secondary | ICD-10-CM

## 2012-01-12 DIAGNOSIS — R1031 Right lower quadrant pain: Secondary | ICD-10-CM | POA: Insufficient documentation

## 2012-01-12 DIAGNOSIS — N949 Unspecified condition associated with female genital organs and menstrual cycle: Secondary | ICD-10-CM

## 2012-01-12 DIAGNOSIS — O2390 Unspecified genitourinary tract infection in pregnancy, unspecified trimester: Secondary | ICD-10-CM | POA: Insufficient documentation

## 2012-01-12 DIAGNOSIS — B9689 Other specified bacterial agents as the cause of diseases classified elsewhere: Secondary | ICD-10-CM | POA: Insufficient documentation

## 2012-01-12 DIAGNOSIS — N760 Acute vaginitis: Secondary | ICD-10-CM | POA: Insufficient documentation

## 2012-01-12 DIAGNOSIS — M62838 Other muscle spasm: Secondary | ICD-10-CM | POA: Insufficient documentation

## 2012-01-12 DIAGNOSIS — A499 Bacterial infection, unspecified: Secondary | ICD-10-CM | POA: Insufficient documentation

## 2012-01-12 DIAGNOSIS — M62830 Muscle spasm of back: Secondary | ICD-10-CM

## 2012-01-12 LAB — URINALYSIS, ROUTINE W REFLEX MICROSCOPIC
Glucose, UA: NEGATIVE mg/dL
pH: 8.5 — ABNORMAL HIGH (ref 5.0–8.0)

## 2012-01-12 LAB — WET PREP, GENITAL
Trich, Wet Prep: NONE SEEN
Yeast Wet Prep HPF POC: NONE SEEN

## 2012-01-12 MED ORDER — CYCLOBENZAPRINE HCL 10 MG PO TABS
10.0000 mg | ORAL_TABLET | Freq: Once | ORAL | Status: AC
  Administered 2012-01-12: 10 mg via ORAL
  Filled 2012-01-12: qty 1

## 2012-01-12 MED ORDER — METRONIDAZOLE 500 MG PO TABS: 500.0000 mg | ORAL_TABLET | Freq: Two times a day (BID) | ORAL | Status: AC

## 2012-01-12 MED ORDER — CYCLOBENZAPRINE HCL 10 MG PO TABS: 10.0000 mg | ORAL_TABLET | Freq: Three times a day (TID) | ORAL | Status: AC | PRN

## 2012-01-12 NOTE — MAU Note (Signed)
Pt came in by EMS. Was at bus stop started having sharp shooting pain in RLQ after carrying son across the street.

## 2012-01-12 NOTE — MAU Provider Note (Signed)
History     CSN: [id number]  Arrival date and time: 01/12/12 1141   First Provider Initiated Contact with Patient 01/12/12 1203      Chief Complaint  Patient presents with  . Abdominal Pain   HPI 26 y.o. G2P1001 at [redacted]w[redacted]d with RLQ pain, started when carrying son to bus stop, sharp. No bleeding, no LOF. Pain resolves with rest. Has been carrying son up and down stairs this morning, gave him a bath prior to carrying him to the bus stop.    Past Medical History  Diagnosis Date  . Asthma   . Other and unspecified ovarian cysts     History reviewed. No pertinent past surgical history.  Family History  Problem Relation Age of Onset  . Diabetes Maternal Grandmother   . Hypertension Paternal Grandmother     History  Substance Use Topics  . Smoking status: Former Smoker -- 0.2 packs/day    Types: Cigarettes    Quit date: 07/27/2011  . Smokeless tobacco: Not on file  . Alcohol Use: No    Allergies: No Known Allergies  Prescriptions prior to admission  Medication Sig Dispense Refill  . albuterol (PROVENTIL) (2.5 MG/3ML) 0.083% nebulizer solution Take 2.5 mg by nebulization every 6 (six) hours as needed. For asthma      . Prenatal Vit-Fe Fumarate-FA (PRENATAL MULTIVITAMIN) TABS Take 1 tablet by mouth every morning.        Review of Systems  Constitutional: Negative.   Respiratory: Negative.   Cardiovascular: Negative.   Gastrointestinal: Positive for abdominal pain. Negative for nausea, vomiting, diarrhea and constipation.  Genitourinary: Negative for dysuria, urgency, frequency, hematuria and flank pain.       Negative for vaginal bleeding, cramping/contractions  Musculoskeletal: Positive for back pain.  Neurological: Negative.   Psychiatric/Behavioral: Negative.    Physical Exam   Blood pressure 119/68, pulse 60, temperature 98.1 F (36.7 C), temperature source Oral, resp. rate 18, last menstrual period 06/23/2011.  Physical Exam  Nursing note and vitals  reviewed. Constitutional: She is oriented to person, place, and time. She appears well-developed and well-nourished. She appears distressed.  Cardiovascular: Normal rate.   Respiratory: Effort normal.  GI: Soft. There is no tenderness.  Genitourinary: No tenderness or bleeding around the vagina. Vaginal discharge (white) found.       Cervix thick and closed  Musculoskeletal: Normal range of motion. She exhibits tenderness (lumbar area, left side).  Neurological: She is alert and oriented to person, place, and time.  Skin: Skin is warm and dry.  Psychiatric: She has a normal mood and affect.   FHR reassuring, TOCO quiet  MAU Course  Procedures  Results for orders placed during the hospital encounter of 01/12/12 (from the past 24 hour(s))  WET PREP, GENITAL     Status: Abnormal   Collection Time   01/12/12 12:10 PM      Component Value Range   Yeast Wet Prep HPF POC NONE SEEN  NONE SEEN   Trich, Wet Prep NONE SEEN  NONE SEEN   Clue Cells Wet Prep HPF POC FEW (*) NONE SEEN   WBC, Wet Prep HPF POC MANY (*) NONE SEEN  URINALYSIS, ROUTINE W REFLEX MICROSCOPIC     Status: Abnormal   Collection Time   01/12/12 12:47 PM      Component Value Range   Color, Urine YELLOW  YELLOW   APPearance HAZY (*) CLEAR   Specific Gravity, Urine 1.020  1.005 - 1.030   pH 8.5 (*) 5.0 -  8.0   Glucose, UA NEGATIVE  NEGATIVE mg/dL   Hgb urine dipstick NEGATIVE  NEGATIVE   Bilirubin Urine NEGATIVE  NEGATIVE   Ketones, ur NEGATIVE  NEGATIVE mg/dL   Protein, ur NEGATIVE  NEGATIVE mg/dL   Urobilinogen, UA 1.0  0.0 - 1.0 mg/dL   Nitrite NEGATIVE  NEGATIVE   Leukocytes, UA SMALL (*) NEGATIVE  URINE MICROSCOPIC-ADD ON     Status: Abnormal   Collection Time   01/12/12 12:47 PM      Component Value Range   Squamous Epithelial / LPF MANY (*) RARE   WBC, UA 0-2  <3 WBC/hpf   Bacteria, UA MANY (*) RARE     Assessment and Plan  26 y.o. G2P1001 at [redacted]w[redacted]d Muscle spasm/round ligament pain - rx flexeril BV - rx  flagyl Precautions rev'd Follow up as scheduled   FRAZIER,NATALIE 01/12/2012, 12:04 PM

## 2012-02-05 ENCOUNTER — Inpatient Hospital Stay (HOSPITAL_COMMUNITY)
Admission: AD | Admit: 2012-02-05 | Discharge: 2012-02-05 | Disposition: A | Discharge status: Home / Self Care | Payer: Medicaid Other | Source: Ambulatory Visit | Attending: Obstetrics | Admitting: Obstetrics

## 2012-02-05 ENCOUNTER — Encounter (HOSPITAL_COMMUNITY): Payer: Self-pay | Admitting: Obstetrics and Gynecology

## 2012-02-05 DIAGNOSIS — B9689 Other specified bacterial agents as the cause of diseases classified elsewhere: Secondary | ICD-10-CM | POA: Insufficient documentation

## 2012-02-05 DIAGNOSIS — A499 Bacterial infection, unspecified: Secondary | ICD-10-CM | POA: Insufficient documentation

## 2012-02-05 DIAGNOSIS — N760 Acute vaginitis: Secondary | ICD-10-CM | POA: Insufficient documentation

## 2012-02-05 DIAGNOSIS — O26899 Other specified pregnancy related conditions, unspecified trimester: Secondary | ICD-10-CM

## 2012-02-05 DIAGNOSIS — O2390 Unspecified genitourinary tract infection in pregnancy, unspecified trimester: Secondary | ICD-10-CM | POA: Insufficient documentation

## 2012-02-05 DIAGNOSIS — N949 Unspecified condition associated with female genital organs and menstrual cycle: Secondary | ICD-10-CM | POA: Insufficient documentation

## 2012-02-05 LAB — URINE MICROSCOPIC-ADD ON

## 2012-02-05 LAB — URINALYSIS, ROUTINE W REFLEX MICROSCOPIC
Glucose, UA: NEGATIVE mg/dL
Hgb urine dipstick: NEGATIVE
Specific Gravity, Urine: >1.03 — ABNORMAL HIGH (ref 1.005–1.030)
Urobilinogen, UA: 0.2 mg/dL (ref 0.0–1.0)
pH: 6 (ref 5.0–8.0)

## 2012-02-05 MED ORDER — ACETAMINOPHEN 325 MG PO TABS
650.0000 mg | ORAL_TABLET | Freq: Once | ORAL | Status: AC
  Administered 2012-02-05: 650 mg via ORAL
  Filled 2012-02-05: qty 2

## 2012-02-05 MED ORDER — COMFORT FIT MATERNITY SUPP MED MISC: 1.0000 [IU] | Status: DC | PRN

## 2012-02-05 NOTE — MAU Note (Signed)
Pt reports she started having pelvic pain and pressure that started this morning. called Md and she told her to get a pregnancy belt. Pt stated this hurts and want something  For the  Pain.

## 2012-02-05 NOTE — MAU Provider Note (Signed)
History     CSN: [id number]  Arrival date and time: 02/05/12 1159   None     Chief Complaint  Patient presents with  . Pelvic Pain   HPI 26 y.o. G2P1001 at [redacted]w[redacted]d with pelvic/vaginal pain and pressure starting today at work. No bleeding, no LOF. Currently being treated for BV.    Past Medical History  Diagnosis Date  . Asthma   . Other and unspecified ovarian cysts     History reviewed. No pertinent past surgical history.  Family History  Problem Relation Age of Onset  . Diabetes Maternal Grandmother   . Hypertension Paternal Grandmother     History  Substance Use Topics  . Smoking status: Former Smoker -- 0.2 packs/day    Types: Cigarettes    Quit date: 07/27/2011  . Smokeless tobacco: Not on file  . Alcohol Use: No    Allergies: No Known Allergies  Prescriptions prior to admission  Medication Sig Dispense Refill  . albuterol (PROVENTIL HFA;VENTOLIN HFA) 108 (90 BASE) MCG/ACT inhaler Inhale 2 puffs into the lungs every 6 (six) hours as needed. Takes for shortness of breath      . Prenatal Vit-Fe Fumarate-FA (PRENATAL 19) tablet Chew 1 tablet by mouth every morning.        Review of Systems  Constitutional: Negative.   Respiratory: Negative.   Cardiovascular: Negative.   Gastrointestinal: Negative for nausea, vomiting, abdominal pain, diarrhea and constipation.  Genitourinary: Negative for dysuria, urgency, frequency, hematuria and flank pain.       Negative for vaginal bleeding, cramping/contractions  Musculoskeletal: Negative.   Neurological: Negative.   Psychiatric/Behavioral: Negative.    Physical Exam   Blood pressure 135/79, pulse 81, temperature 98 F (36.7 C), temperature source Oral, resp. rate 18, height 5\' 8"  (1.727 m), weight 185 lb (83.915 kg), last menstrual period 06/23/2011, unknown if currently breastfeeding.  Physical Exam  Nursing note and vitals reviewed. Constitutional: She is oriented to person, place, and time. She appears  well-developed and well-nourished. No distress.  Cardiovascular: Normal rate.   Respiratory: Effort normal.  GI: Soft. There is no tenderness.  Genitourinary: Uterus is enlarged (c/w dates). Uterus is not tender. Cervix exhibits no discharge. No bleeding around the vagina. No vaginal discharge found.       SVE: closed/thick/high/post  Musculoskeletal: Normal range of motion.  Neurological: She is alert and oriented to person, place, and time.  Skin: Skin is warm and dry.  Psychiatric: She has a normal mood and affect.   EFM reactive, TOCO quiet MAU Course  Procedures Results for orders placed during the hospital encounter of 02/05/12 (from the past 24 hour(s))  URINALYSIS, ROUTINE W REFLEX MICROSCOPIC     Status: Abnormal   Collection Time   02/05/12 12:10 PM      Component Value Range   Color, Urine YELLOW  YELLOW   APPearance CLEAR  CLEAR   Specific Gravity, Urine >1.030 (*) 1.005 - 1.030   pH 6.0  5.0 - 8.0   Glucose, UA NEGATIVE  NEGATIVE mg/dL   Hgb urine dipstick NEGATIVE  NEGATIVE   Bilirubin Urine NEGATIVE  NEGATIVE   Ketones, ur NEGATIVE  NEGATIVE mg/dL   Protein, ur NEGATIVE  NEGATIVE mg/dL   Urobilinogen, UA 0.2  0.0 - 1.0 mg/dL   Nitrite NEGATIVE  NEGATIVE   Leukocytes, UA SMALL (*) NEGATIVE  URINE MICROSCOPIC-ADD ON     Status: Abnormal   Collection Time   02/05/12 12:10 PM      Component  Value Range   Squamous Epithelial / LPF MANY (*) RARE   WBC, UA 21-50  <3 WBC/hpf   RBC / HPF 0-2  <3 RBC/hpf   Bacteria, UA MANY (*) RARE     Assessment and Plan  26 y.o. G2P1001 at [redacted]w[redacted]d  1. Pelvic pain in pregnancy    Follow up as scheduled, precautions rev'd  Meghan Salazar 02/05/2012, 12:41 PM

## 2012-02-22 ENCOUNTER — Encounter (HOSPITAL_COMMUNITY): Payer: Self-pay | Admitting: *Deleted

## 2012-02-22 ENCOUNTER — Inpatient Hospital Stay (HOSPITAL_COMMUNITY)
Admission: AD | Admit: 2012-02-22 | Discharge: 2012-02-22 | Disposition: A | Discharge status: Home / Self Care | Payer: Medicaid Other | Source: Ambulatory Visit | Attending: Obstetrics | Admitting: Obstetrics

## 2012-02-22 DIAGNOSIS — M549 Dorsalgia, unspecified: Secondary | ICD-10-CM | POA: Insufficient documentation

## 2012-02-22 DIAGNOSIS — M62830 Muscle spasm of back: Secondary | ICD-10-CM

## 2012-02-22 DIAGNOSIS — M5380 Other specified dorsopathies, site unspecified: Secondary | ICD-10-CM | POA: Insufficient documentation

## 2012-02-22 DIAGNOSIS — O99891 Other specified diseases and conditions complicating pregnancy: Secondary | ICD-10-CM | POA: Insufficient documentation

## 2012-02-22 MED ORDER — CYCLOBENZAPRINE HCL 10 MG PO TABS: 10.0000 mg | ORAL_TABLET | Freq: Three times a day (TID) | ORAL | Status: DC | PRN

## 2012-02-22 NOTE — MAU Note (Signed)
Pt states she has been bothered by back pain for about a month,and has been taking medication for pain. Pt states she took her last pill today at work. Pt state belt has not helped her

## 2012-02-22 NOTE — MAU Provider Note (Signed)
History     CSN: [id number]  Arrival date and time: 02/22/12 1541   None     Chief Complaint  Patient presents with  . Tailbone Pain   HPI 26 y.o. G2P1001 at [redacted]w[redacted]d with back pain, ongoing throughout pregnancy. Was severe earlier, took Flexeril, now pain is better. Denies contractions, LOF, bleeding. + fetal movement. Seen multiple times this pregnancy in MAU for musculoskeletal pain related issues.    Past Medical History  Diagnosis Date  . Asthma   . Other and unspecified ovarian cysts     History reviewed. No pertinent past surgical history.  Family History  Problem Relation Age of Onset  . Diabetes Maternal Grandmother   . Hypertension Paternal Grandmother     History  Substance Use Topics  . Smoking status: Former Smoker -- 0.2 packs/day    Types: Cigarettes    Quit date: 07/27/2011  . Smokeless tobacco: Not on file  . Alcohol Use: No    Allergies: No Known Allergies  Prescriptions prior to admission  Medication Sig Dispense Refill  . albuterol (PROVENTIL HFA;VENTOLIN HFA) 108 (90 BASE) MCG/ACT inhaler Inhale 2 puffs into the lungs every 6 (six) hours as needed. Takes for shortness of breath      . cyclobenzaprine (FLEXERIL) 10 MG tablet Take 10 mg by mouth 3 (three) times daily as needed. For muscle spasm      . Elastic Bandages & Supports (COMFORT FIT MATERNITY SUPP MED) MISC 1 Units by Does not apply route as needed (for pregnancy related pain).  1 each  0  . metroNIDAZOLE (FLAGYL) 500 MG tablet Take 500 mg by mouth 2 (two) times daily.      . Prenatal Vit-Fe Fumarate-FA (PRENATAL 19) tablet Chew 1 tablet by mouth every morning.        Review of Systems  Constitutional: Negative.   Respiratory: Negative.   Cardiovascular: Negative.   Gastrointestinal: Negative for nausea, vomiting, abdominal pain, diarrhea and constipation.  Genitourinary: Negative for dysuria, urgency, frequency, hematuria and flank pain.       Negative for vaginal bleeding,  cramping/contractions  Musculoskeletal: Positive for back pain and joint pain.  Neurological: Negative.   Psychiatric/Behavioral: Negative.    Physical Exam   Blood pressure 126/79, pulse 73, temperature 97.9 F (36.6 C), temperature source Oral, resp. rate 18, last menstrual period 06/23/2011, unknown if currently breastfeeding.  Physical Exam  Nursing note and vitals reviewed. Constitutional: She is oriented to person, place, and time. She appears well-developed and well-nourished. No distress.  Cardiovascular: Normal rate.   Respiratory: Effort normal.  GI: Soft. There is no tenderness.  Musculoskeletal: Normal range of motion. She exhibits tenderness (mid back, left side).       Arms: Neurological: She is alert and oriented to person, place, and time.  Skin: Skin is warm and dry.  Psychiatric: She has a normal mood and affect.   EFM reactive, TOCO quiet  MAU Course  Procedures    Assessment and Plan   1. Back muscle spasm       Medication List     As of 02/22/2012  5:02 PM    CONTINUE taking these medications         COMFORT FIT MATERNITY SUPP MED Misc   1 Units by Does not apply route as needed (for pregnancy related pain).      cyclobenzaprine 10 MG tablet   Commonly known as: FLEXERIL   Take 1 tablet (10 mg total) by mouth 3 (three) times  daily as needed. For muscle spasm      metroNIDAZOLE 500 MG tablet   Commonly known as: FLAGYL      PRENATAL 19 tablet      STOP taking these medications         albuterol 108 (90 BASE) MCG/ACT inhaler   Commonly known as: PROVENTIL HFA;VENTOLIN HFA          Where to get your medications    These are the prescriptions that you need to pick up. We sent them to a specific pharmacy, so you will need to go there to get them.   [facility] [address] [place], [place] - [address]    [address] [place] [address]    Phone: [phone]        cyclobenzaprine 10 MG tablet             Follow-up Information    Follow up with MARSHALL,BERNARD A, MD. (as scheduled)    Contact information:   [address] [place] [address] [phone]            Ilma Achee 02/22/2012, 3:58 PM

## 2012-02-22 NOTE — Progress Notes (Signed)
Pt states she was having serve back pain, and takes medication for it. Pt states she took last pill at 1435. Pt states back is starting to feel better. Pt denies abdominal pain

## 2012-03-13 LAB — OB RESULTS CONSOLE GBS: GBS: POSITIVE

## 2012-03-27 ENCOUNTER — Encounter (HOSPITAL_COMMUNITY): Payer: Self-pay

## 2012-03-27 ENCOUNTER — Inpatient Hospital Stay (HOSPITAL_COMMUNITY)
Admission: AD | Admit: 2012-03-27 | Discharge: 2012-03-27 | Disposition: A | Discharge status: Skilled Nursing Facility | Payer: Medicaid Other | Attending: Emergency Medicine | Admitting: Emergency Medicine

## 2012-03-27 DIAGNOSIS — Z8742 Personal history of other diseases of the female genital tract: Secondary | ICD-10-CM | POA: Insufficient documentation

## 2012-03-27 DIAGNOSIS — O479 False labor, unspecified: Secondary | ICD-10-CM | POA: Insufficient documentation

## 2012-03-27 DIAGNOSIS — Z87891 Personal history of nicotine dependence: Secondary | ICD-10-CM | POA: Insufficient documentation

## 2012-03-27 DIAGNOSIS — Z8709 Personal history of other diseases of the respiratory system: Secondary | ICD-10-CM | POA: Insufficient documentation

## 2012-03-27 NOTE — ED Notes (Signed)
OB rapid response nurse arrived. 

## 2012-03-27 NOTE — ED Provider Notes (Signed)
History     CSN: [id number]  Arrival date & time 03/27/12  5621   First MD Initiated Contact with Patient 03/27/12 1001      Chief Complaint  Patient presents with  . Labor Eval    (Consider location/radiation/quality/duration/timing/severity/associated sxs/prior treatment) HPI26 F G2P1 here with concern for early labor. Reports loss of fluid this AM while sitting on toilet. Also reports contractions. Last one several minutes ago described as 10/10 cramping. Pt says she is [redacted] weeks pregnant and received prenatal care. No recent trauma. No fever/chills. No prior evaluation today.   Past Medical History  Diagnosis Date  . Asthma   . Other and unspecified ovarian cysts     History reviewed. No pertinent past surgical history.  Family History  Problem Relation Age of Onset  . Diabetes Maternal Grandmother   . Hypertension Paternal Grandmother     History  Substance Use Topics  . Smoking status: Former Smoker -- 0.2 packs/day    Types: Cigarettes    Quit date: 07/27/2011  . Smokeless tobacco: Not on file  . Alcohol Use: No    OB History    Grav Para Term Preterm Abortions TAB SAB Ect Mult Living   2 1 1       1       Review of Systems Negative for respiratory distress, cough. Negative for vomiting, diarrhea.  All other systems reviewed and negative unless noted in HPI.    Allergies  Review of patient's allergies indicates no known allergies.  Home Medications   Current Outpatient Rx  Name  Route  Sig  Dispense  Refill  . CYCLOBENZAPRINE HCL 10 MG PO TABS   Oral   Take 1 tablet (10 mg total) by mouth 3 (three) times daily as needed. For muscle spasm   30 tablet   1   . COMFORT FIT MATERNITY SUPP MED MISC   Does not apply   1 Units by Does not apply route as needed (for pregnancy related pain).   1 each   0   . METRONIDAZOLE 500 MG PO TABS   Oral   Take 500 mg by mouth 2 (two) times daily.         Marland Kitchen PRENATAL 19 PO CHEW   Oral   Chew 1 tablet by  mouth every morning.           BP 124/74  Pulse 80  Temp 97.8 F (36.6 C) (Oral)  Resp 18  LMP 06/23/2011  Physical Exam Nursing note and vitals reviewed.  Constitutional: Pt is alert and appears stated age. Oropharynx: Airway open without erythema or exudate. Respiratory: No respiratory distress. Equal breathing bilaterally. CV: Extremities warm and well perfused. Neuro: No motor nor sensory deficit. Head: Normocephalic and atraumatic. Eyes: No conjunctivitis, no scleral icterus. Neck: Supple, no mass. Chest: Non-tender. Abdomen: Gravid MSK: Extremities are atraumatic without deformity. Skin: No rash, no wounds. GU: Normal external exam  ED Course  Procedures (including critical care time)  Labs Reviewed - No data to display No results found.   Diagnosis: 1. Labor evaluation 2. Contractions 3. Loss of fluid   MDM  26 y.o. F G2P1 at 37 wks who reports receiving routine prenatal care presents for labor evaluation after feeling loss of fluid and several contractions. States she previously had a precipitous delivery and was concerned it may happen again. External exam without signs of labor. Ob nurse consulted and will come to ED. Good FHTs.   Discussed with Ob nurse,  SVE 4/thick/-2. Feel pt is stable for d/c to [facility] hospital. Pt moved.         Charm Barges, MD 03/27/12 [phone]

## 2012-03-27 NOTE — ED Notes (Signed)
Pt was sitting on toilet this am, felt that her water broke, states keeps peeing on self

## 2012-03-27 NOTE — ED Notes (Signed)
External monitors taken off for patient [facility] via [facility]

## 2012-03-27 NOTE — ED Notes (Signed)
Gwenette Greet charge MAU RN, notified of patients status and transfer via carelink

## 2012-03-27 NOTE — ED Notes (Signed)
Patient states feeling contractions and thinks water broke EFM applied SVE 4/Thick/-2

## 2012-03-27 NOTE — ED Provider Notes (Signed)
I saw and evaluated the patient, reviewed the resident's note and I agree with the findings and plan. Gravid at term.  Water broke.  Rare contraction.  Will consult ob for transfer for delivery.   Cheri Guppy, MD 03/27/12 1022

## 2012-03-27 NOTE — MAU Provider Note (Signed)
26 y.o. G2P1001 at [redacted]w[redacted]d with ? SROM, increased vaginal discharge today. RN requests exam for r/o rupture only.   Spec: large amount of creamy white d/c and mucous Fern negative  RN to report to Dr. Clearance Coots for plan of care

## 2012-03-27 NOTE — ED Notes (Signed)
Dr. Clearance Coots updated on patient status [facility] for further eval  and possible ROM

## 2012-03-28 ENCOUNTER — Inpatient Hospital Stay (HOSPITAL_COMMUNITY)
Admission: AD | Admit: 2012-03-28 | Discharge: 2012-03-28 | Disposition: A | Discharge status: Home / Self Care | Payer: Medicaid Other | Source: Ambulatory Visit | Attending: Obstetrics | Admitting: Obstetrics

## 2012-03-28 DIAGNOSIS — O479 False labor, unspecified: Secondary | ICD-10-CM | POA: Insufficient documentation

## 2012-03-28 NOTE — MAU Note (Signed)
Worsening contractions,

## 2012-03-28 NOTE — ED Provider Notes (Signed)
I saw and evaluated the patient, reviewed the resident's note and I agree with the findings and plan.  Alcie Runions, MD 03/28/12 0712 

## 2012-04-10 ENCOUNTER — Encounter (HOSPITAL_COMMUNITY): Payer: Self-pay | Admitting: *Deleted

## 2012-04-10 ENCOUNTER — Telehealth (HOSPITAL_COMMUNITY): Payer: Self-pay | Admitting: *Deleted

## 2012-04-10 NOTE — Telephone Encounter (Signed)
Preadmission screen  

## 2012-04-14 ENCOUNTER — Inpatient Hospital Stay (HOSPITAL_COMMUNITY): Payer: Medicaid Other | Admitting: Anesthesiology

## 2012-04-14 ENCOUNTER — Inpatient Hospital Stay (HOSPITAL_COMMUNITY)
Admission: AD | Admit: 2012-04-14 | Discharge: 2012-04-17 | DRG: 774 | Disposition: A | Discharge status: Home / Self Care | Payer: Medicaid Other | Source: Ambulatory Visit | Attending: Obstetrics | Admitting: Obstetrics

## 2012-04-14 ENCOUNTER — Encounter (HOSPITAL_COMMUNITY): Payer: Self-pay | Admitting: *Deleted

## 2012-04-14 ENCOUNTER — Encounter (HOSPITAL_COMMUNITY): Payer: Self-pay | Admitting: Anesthesiology

## 2012-04-14 DIAGNOSIS — O99892 Other specified diseases and conditions complicating childbirth: Secondary | ICD-10-CM | POA: Diagnosis present

## 2012-04-14 DIAGNOSIS — Z22330 Carrier of Group B streptococcus: Secondary | ICD-10-CM

## 2012-04-14 LAB — CBC
HCT: 35.9 % — ABNORMAL LOW (ref 36.0–46.0)
Hemoglobin: 12.7 g/dL (ref 12.0–15.0)
MCH: 32.4 pg (ref 26.0–34.0)
MCHC: 35.4 g/dL (ref 30.0–36.0)
RBC: 3.92 MIL/uL (ref 3.87–5.11)

## 2012-04-14 MED ORDER — FLEET ENEMA 7-19 GM/118ML RE ENEM: 1.0000 | ENEMA | Freq: Once | RECTAL | Status: DC

## 2012-04-14 MED ORDER — ACETAMINOPHEN 325 MG PO TABS: 650.0000 mg | ORAL_TABLET | ORAL | Status: DC | PRN

## 2012-04-14 MED ORDER — CITRIC ACID-SODIUM CITRATE 334-500 MG/5ML PO SOLN: 30.0000 mL | ORAL | Status: DC | PRN

## 2012-04-14 MED ORDER — PHENYLEPHRINE 40 MCG/ML (10ML) SYRINGE FOR IV PUSH (FOR BLOOD PRESSURE SUPPORT)
80.0000 ug | PREFILLED_SYRINGE | INTRAVENOUS | Status: DC | PRN
Start: 2012-04-14 — End: 2012-04-15

## 2012-04-14 MED ORDER — DIPHENHYDRAMINE HCL 50 MG/ML IJ SOLN: 12.5000 mg | INTRAMUSCULAR | Status: DC | PRN

## 2012-04-14 MED ORDER — SODIUM CHLORIDE 0.9 % IV SOLN
2.0000 g | Freq: Once | INTRAVENOUS | Status: AC
  Administered 2012-04-14: 2 g via INTRAVENOUS
  Filled 2012-04-14: qty 2000

## 2012-04-14 MED ORDER — OXYTOCIN 40 UNITS IN LACTATED RINGERS INFUSION - SIMPLE MED
1.0000 m[IU]/min | INTRAVENOUS | Status: DC
  Administered 2012-04-14: 2 m[IU]/min via INTRAVENOUS

## 2012-04-14 MED ORDER — LACTATED RINGERS IV SOLN
500.0000 mL | INTRAVENOUS | Status: DC | PRN
  Administered 2012-04-14 (×2): 500 mL via INTRAVENOUS

## 2012-04-14 MED ORDER — OXYTOCIN BOLUS FROM INFUSION
500.0000 mL | INTRAVENOUS | Status: DC
  Administered 2012-04-15: 500 mL via INTRAVENOUS

## 2012-04-14 MED ORDER — PHENYLEPHRINE 40 MCG/ML (10ML) SYRINGE FOR IV PUSH (FOR BLOOD PRESSURE SUPPORT)
80.0000 ug | PREFILLED_SYRINGE | INTRAVENOUS | Status: DC | PRN
  Filled 2012-04-14: qty 5

## 2012-04-14 MED ORDER — LACTATED RINGERS IV SOLN
INTRAVENOUS | Status: DC
  Administered 2012-04-14: 20:00:00 via INTRAVENOUS

## 2012-04-14 MED ORDER — IBUPROFEN 600 MG PO TABS: 600.0000 mg | ORAL_TABLET | Freq: Four times a day (QID) | ORAL | Status: DC | PRN

## 2012-04-14 MED ORDER — OXYTOCIN 40 UNITS IN LACTATED RINGERS INFUSION - SIMPLE MED
62.5000 mL/h | INTRAVENOUS | Status: DC
  Filled 2012-04-14: qty 1000

## 2012-04-14 MED ORDER — EPHEDRINE 5 MG/ML INJ
10.0000 mg | INTRAVENOUS | Status: DC | PRN
Start: 2012-04-14 — End: 2012-04-15

## 2012-04-14 MED ORDER — LIDOCAINE HCL (PF) 1 % IJ SOLN
30.0000 mL | INTRAMUSCULAR | Status: DC | PRN
  Filled 2012-04-14: qty 30

## 2012-04-14 MED ORDER — FENTANYL 2.5 MCG/ML BUPIVACAINE 1/10 % EPIDURAL INFUSION (WH - ANES)
14.0000 mL/h | INTRAMUSCULAR | Status: DC
  Filled 2012-04-14: qty 125

## 2012-04-14 MED ORDER — TERBUTALINE SULFATE 1 MG/ML IJ SOLN: 0.2500 mg | Freq: Once | INTRAMUSCULAR | Status: AC | PRN

## 2012-04-14 MED ORDER — EPHEDRINE 5 MG/ML INJ
10.0000 mg | INTRAVENOUS | Status: DC | PRN
  Administered 2012-04-14: 10 mg via INTRAVENOUS
  Filled 2012-04-14: qty 4

## 2012-04-14 MED ORDER — SODIUM CHLORIDE 0.9 % IV SOLN
2.0000 g | Freq: Four times a day (QID) | INTRAVENOUS | Status: DC
  Filled 2012-04-14 (×3): qty 2000

## 2012-04-14 MED ORDER — FENTANYL 2.5 MCG/ML BUPIVACAINE 1/10 % EPIDURAL INFUSION (WH - ANES)
INTRAMUSCULAR | Status: DC | PRN
  Administered 2012-04-14: 14 mL/h via EPIDURAL

## 2012-04-14 MED ORDER — ONDANSETRON HCL 4 MG/2ML IJ SOLN: 4.0000 mg | Freq: Four times a day (QID) | INTRAMUSCULAR | Status: DC | PRN

## 2012-04-14 MED ORDER — LIDOCAINE HCL (PF) 1 % IJ SOLN
INTRAMUSCULAR | Status: DC | PRN
  Administered 2012-04-14 (×2): 9 mL

## 2012-04-14 MED ORDER — LACTATED RINGERS IV SOLN: 500.0000 mL | Freq: Once | INTRAVENOUS | Status: DC

## 2012-04-14 MED ORDER — OXYCODONE-ACETAMINOPHEN 5-325 MG PO TABS: 1.0000 | ORAL_TABLET | ORAL | Status: DC | PRN

## 2012-04-14 NOTE — Anesthesia Procedure Notes (Signed)
Epidural Patient location during procedure: OB Start time: 04/14/2012 7:25 PM End time: 04/14/2012 7:29 PM  Staffing Anesthesiologist: Sandrea Hughs Performed by: anesthesiologist   Preanesthetic Checklist Completed: patient identified, site marked, surgical consent, pre-op evaluation, timeout performed, IV checked, risks and benefits discussed and monitors and equipment checked  Epidural Patient position: sitting Prep: site prepped and draped and DuraPrep Patient monitoring: continuous pulse ox and blood pressure Approach: midline Injection technique: LOR air  Needle:  Needle type: Tuohy  Needle gauge: 17 G Needle length: 9 cm and 9 Needle insertion depth: 6 cm Catheter type: closed end flexible Catheter size: 19 Gauge Catheter at skin depth: 11 cm Test dose: negative and Other  Assessment Sensory level: T8 Events: blood not aspirated, injection not painful, no injection resistance, negative IV test and no paresthesia  Additional Notes Reason for block:procedure for pain

## 2012-04-14 NOTE — Anesthesia Preprocedure Evaluation (Signed)
Anesthesia Evaluation  Patient identified by MRN, date of birth, ID band Patient awake    Reviewed: Allergy & Precautions, H&P , Patient's Chart, lab work & pertinent test results  Airway Mallampati: II TM Distance: >3 FB Neck ROM: full    Dental No notable dental hx.    Pulmonary    Pulmonary exam normal       Cardiovascular negative cardio ROS      Neuro/Psych negative neurological ROS  negative psych ROS   GI/Hepatic negative GI ROS, Neg liver ROS,   Endo/Other  negative endocrine ROS  Renal/GU negative Renal ROS     Musculoskeletal negative musculoskeletal ROS (+)   Abdominal Normal abdominal exam  (+)   Peds negative pediatric ROS (+)  Hematology negative hematology ROS (+)   Anesthesia Other Findings   Reproductive/Obstetrics (+) Pregnancy                           Anesthesia Physical Anesthesia Plan  ASA: II  Anesthesia Plan: Epidural   Post-op Pain Management:    Induction:   Airway Management Planned:   Additional Equipment:   Intra-op Plan:   Post-operative Plan:   Informed Consent: I have reviewed the patients History and Physical, chart, labs and discussed the procedure including the risks, benefits and alternatives for the proposed anesthesia with the patient or authorized representative who has indicated his/her understanding and acceptance.     Plan Discussed with:   Anesthesia Plan Comments:         Anesthesia Quick Evaluation

## 2012-04-14 NOTE — H&P (Signed)
Meghan Salazar is a 26 y.o. female presenting for UC's.. Maternal Medical History:  Reason for admission: Reason for admission: contractions.  26 yo G2 P1.  EDC 04-18-12.  Contractions: Onset was 6-12 hours ago.   Perceived severity is strong.    Fetal activity: Perceived fetal activity is normal.   Last perceived fetal movement was within the past hour.    Prenatal complications: no prenatal complications Prenatal Complications - Diabetes: none.    OB History    Grav Para Term Preterm Abortions TAB SAB Ect Mult Living   2 1 1       1      Past Medical History  Diagnosis Date  . Asthma   . Other and unspecified ovarian cysts    History reviewed. No pertinent past surgical history. Family History: family history includes Diabetes in her maternal grandmother and paternal grandmother and Hypertension in her paternal grandmother. Social History:  reports that she quit smoking about 8 months ago. Her smoking use included Cigarettes. She smoked .25 packs per day. She has never used smokeless tobacco. She reports that she does not drink alcohol or use illicit drugs.   Prenatal Transfer Tool  Maternal Diabetes: No Genetic Screening: Normal Maternal Ultrasounds/Referrals: Normal Fetal Ultrasounds or other Referrals:  None Maternal Substance Abuse:  No Significant Maternal Medications:  None Significant Maternal Lab Results:  None Other Comments:  None  Review of Systems  All other systems reviewed and are negative.    Dilation: 6 Effacement (%): 90 Station: 0 Exam by:: K.Wilson,RN Blood pressure 118/65, pulse 93, temperature 99.1 F (37.3 C), temperature source Oral, resp. rate 18, height 5\' 7"  (1.702 m), weight 214 lb (97.07 kg), last menstrual period 06/23/2011. Maternal Exam:  Uterine Assessment: Contraction strength is moderate.  Abdomen: Patient reports no abdominal tenderness. Fetal presentation: vertex  Introitus: Normal vulva. Normal vagina.  Pelvis: adequate for  delivery.   Cervix: Cervix evaluated by digital exam.     Physical Exam  Nursing note and vitals reviewed. Constitutional: She is oriented to person, place, and time. She appears well-developed and well-nourished.  HENT:  Head: Normocephalic and atraumatic.  Eyes: Conjunctivae normal are normal. Pupils are equal, round, and reactive to light.  Neck: Normal range of motion. Neck supple.  Cardiovascular: Normal rate and regular rhythm.   Respiratory: Effort normal and breath sounds normal.  GI: Soft.  Genitourinary: Vagina normal and uterus normal.  Musculoskeletal: Normal range of motion.  Neurological: She is alert and oriented to person, place, and time.  Skin: Skin is warm and dry.  Psychiatric: She has a normal mood and affect. Her behavior is normal. Judgment and thought content normal.    Prenatal labs: ABO, Rh: B/Positive/-- (08/23 0000) Antibody: Negative (08/23 0000) Rubella: Immune (08/23 0000) RPR: Nonreactive (08/23 0000)  HBsAg: Negative (08/23 0000)  HIV: Non-reactive (08/23 0000)  GBS: Positive (10/29 0000)   Assessment/Plan: 39.4 weeks.  Active labor.  Expectant.   Saban Heinlen A 04/14/2012, 6:48 PM

## 2012-04-14 NOTE — Progress Notes (Signed)
Meghan Salazar is a 26 y.o. G2P1001 at [redacted]w[redacted]d by LMP admitted for active labor, rupture of membranes  Subjective:   Objective: BP 119/70  Pulse 98  Temp 99.1 F (37.3 C) (Oral)  Resp 18  Ht 5\' 7"  (1.702 m)  Wt 214 lb (97.07 kg)  BMI 33.52 kg/m2  LMP 06/23/2011      FHT:  FHR: 150 bpm, variability: moderate,  accelerations:  Present,  decelerations:  Absent UC:   regular, every 3 minutes SVE:   Dilation: 6 Effacement (%): 90 Station: 0 Exam by:: K.Wilson,RN  Labs: Lab Results  Component Value Date   WBC 11.3* 04/14/2012   HGB 12.7 04/14/2012   HCT 35.9* 04/14/2012   MCV 91.6 04/14/2012   PLT 180 04/14/2012    Assessment / Plan: Spontaneous labor, progressing normally  Labor: Progressing normally Preeclampsia:  n/a Fetal Wellbeing:  Category I Pain Control:  Epidural I/D:  n/a Anticipated MOD:  NSVD  Reggie Bise A 04/14/2012, 7:13 PM

## 2012-04-15 ENCOUNTER — Encounter (HOSPITAL_COMMUNITY): Payer: Self-pay | Admitting: *Deleted

## 2012-04-15 LAB — ABO/RH: ABO/RH(D): B POS

## 2012-04-15 LAB — CBC
HCT: 28.3 % — ABNORMAL LOW (ref 36.0–46.0)
MCH: 31.7 pg (ref 26.0–34.0)
MCV: 92.5 fL (ref 78.0–100.0)
RBC: 3.06 MIL/uL — ABNORMAL LOW (ref 3.87–5.11)
WBC: 12.6 10*3/uL — ABNORMAL HIGH (ref 4.0–10.5)

## 2012-04-15 MED ORDER — IBUPROFEN 600 MG PO TABS
600.0000 mg | ORAL_TABLET | Freq: Four times a day (QID) | ORAL | Status: DC
  Administered 2012-04-15 – 2012-04-17 (×10): 600 mg via ORAL
  Filled 2012-04-15 (×10): qty 1

## 2012-04-15 MED ORDER — SIMETHICONE 80 MG PO CHEW: 80.0000 mg | CHEWABLE_TABLET | ORAL | Status: DC | PRN

## 2012-04-15 MED ORDER — LANOLIN HYDROUS EX OINT: TOPICAL_OINTMENT | CUTANEOUS | Status: DC | PRN

## 2012-04-15 MED ORDER — ONDANSETRON HCL 4 MG PO TABS: 4.0000 mg | ORAL_TABLET | ORAL | Status: DC | PRN

## 2012-04-15 MED ORDER — DIBUCAINE 1 % RE OINT
1.0000 "application " | TOPICAL_OINTMENT | RECTAL | Status: DC | PRN
  Filled 2012-04-15: qty 28

## 2012-04-15 MED ORDER — SENNOSIDES-DOCUSATE SODIUM 8.6-50 MG PO TABS
2.0000 | ORAL_TABLET | Freq: Every day | ORAL | Status: DC
  Administered 2012-04-16 (×2): 2 via ORAL

## 2012-04-15 MED ORDER — OXYCODONE-ACETAMINOPHEN 5-325 MG PO TABS
1.0000 | ORAL_TABLET | ORAL | Status: DC | PRN
  Administered 2012-04-15 (×3): 2 via ORAL
  Administered 2012-04-16 (×4): 1 via ORAL
  Administered 2012-04-17 (×2): 2 via ORAL
  Filled 2012-04-15 (×4): qty 2
  Filled 2012-04-15 (×5): qty 1
  Filled 2012-04-15: qty 2

## 2012-04-15 MED ORDER — PRENATAL MULTIVITAMIN CH
1.0000 | ORAL_TABLET | Freq: Every day | ORAL | Status: DC
  Administered 2012-04-15 – 2012-04-17 (×3): 1 via ORAL
  Filled 2012-04-15 (×3): qty 1

## 2012-04-15 MED ORDER — OXYTOCIN 10 UNIT/ML IJ SOLN
INTRAMUSCULAR | Status: AC
  Administered 2012-04-15: 10 [IU]
  Filled 2012-04-15: qty 1

## 2012-04-15 MED ORDER — WITCH HAZEL-GLYCERIN EX PADS: 1.0000 "application " | MEDICATED_PAD | CUTANEOUS | Status: DC | PRN

## 2012-04-15 MED ORDER — BENZOCAINE-MENTHOL 20-0.5 % EX AERO
1.0000 "application " | INHALATION_SPRAY | CUTANEOUS | Status: DC | PRN
  Administered 2012-04-15: 1 via TOPICAL
  Filled 2012-04-15 (×2): qty 56

## 2012-04-15 MED ORDER — ONDANSETRON HCL 4 MG/2ML IJ SOLN: 4.0000 mg | INTRAMUSCULAR | Status: DC | PRN

## 2012-04-15 MED ORDER — ZOLPIDEM TARTRATE 5 MG PO TABS: 5.0000 mg | ORAL_TABLET | Freq: Every evening | ORAL | Status: DC | PRN

## 2012-04-15 MED ORDER — MEDROXYPROGESTERONE ACETATE 150 MG/ML IM SUSP: 150.0000 mg | INTRAMUSCULAR | Status: DC | PRN

## 2012-04-15 MED ORDER — DIPHENHYDRAMINE HCL 25 MG PO CAPS: 25.0000 mg | ORAL_CAPSULE | Freq: Four times a day (QID) | ORAL | Status: DC | PRN

## 2012-04-15 MED ORDER — TETANUS-DIPHTH-ACELL PERTUSSIS 5-2.5-18.5 LF-MCG/0.5 IM SUSP: 0.5000 mL | Freq: Once | INTRAMUSCULAR | Status: DC

## 2012-04-15 MED ORDER — OXYTOCIN 40 UNITS IN LACTATED RINGERS INFUSION - SIMPLE MED: 62.5000 mL/h | INTRAVENOUS | Status: DC | PRN

## 2012-04-15 NOTE — Progress Notes (Signed)
Meghan Salazar is a 26 y.o. G2P1001 at [redacted]w[redacted]d by LMP admitted for active labor  Subjective:   Objective: BP 103/53  Pulse 81  Temp 97.5 F (36.4 C) (Oral)  Resp 20  Ht 5\' 7"  (1.702 m)  Wt 214 lb (97.07 kg)  BMI 33.52 kg/m2  SpO2 100%  LMP 06/23/2011      FHT:  FHR: 150-160 bpm, variability: moderate,  accelerations:  Present,  decelerations:  Present variable UC:   regular, every 2-3 minutes SVE:   Dilation: 10 Effacement (%): 100 Station: +3 Exam by:: ansah-mensah, rnc  Labs: Lab Results  Component Value Date   WBC 11.3* 04/14/2012   HGB 12.7 04/14/2012   HCT 35.9* 04/14/2012   MCV 91.6 04/14/2012   PLT 180 04/14/2012    Assessment / Plan: Augmentation of labor, progressing well  Labor: Progressing normally Preeclampsia:  n/a Fetal Wellbeing:  Category II Pain Control:  Epidural I/D:  n/a Anticipated MOD:  NSVD  HARPER,CHARLES A 04/15/2012, 12:01 AM

## 2012-04-15 NOTE — Progress Notes (Signed)
Post Partum Day 0 Subjective: no complaints  Objective: Blood pressure 111/70, pulse 91, temperature 98 F (36.7 C), temperature source Oral, resp. rate 20, height 5\' 7"  (1.702 m), weight 214 lb (97.07 kg), last menstrual period 06/23/2011, SpO2 98.00%, unknown if currently breastfeeding.  Physical Exam:  General: alert and no distress Lochia: appropriate Uterine Fundus: firm Incision: healing well DVT Evaluation: No evidence of DVT seen on physical exam.   Basename 04/15/12 0555 04/14/12 1815  HGB 9.7* 12.7  HCT 28.3* 35.9*    Assessment/Plan: Plan for discharge tomorrow   LOS: 1 day   HARPER,CHARLES A 04/15/2012, 6:34 AM

## 2012-04-15 NOTE — Anesthesia Postprocedure Evaluation (Signed)
Anesthesia Post Note  Patient: [name]  Procedure(s) Performed: * No procedures listed *  Anesthesia type: Epidural  Patient location: Mother/Baby  Post pain: Pain level controlled  Post assessment: Post-op Vital signs reviewed  Last Vitals:  Filed Vitals:   04/15/12 0405  BP: 111/70  Pulse: 91  Temp: 36.7 C  Resp: 20    Post vital signs: Reviewed  Level of consciousness:alert  Complications: No apparent anesthesia complications

## 2012-04-16 NOTE — Progress Notes (Signed)
UR chart review completed.  

## 2012-04-17 LAB — TYPE AND SCREEN
Antibody Screen: NEGATIVE
Unit division: 0

## 2012-04-17 NOTE — Progress Notes (Signed)
Patient ID: Meghan Salazar, female   DOB: [birth year], 26 y.o.   MRN: [id number] Postpartum day 2 Vital signs normal Fundus firm Legs negative No complaints discharge today

## 2012-04-17 NOTE — Discharge Summary (Signed)
Obstetric Discharge Summary Reason for Admission: onset of labor Prenatal Procedures: none Intrapartum Procedures: spontaneous vaginal delivery Postpartum Procedures: none Complications-Operative and Postpartum: none Hemoglobin  Date Value Range Status  04/15/2012 9.7* 12.0 - 15.0 g/dL Final     DELTA CHECK NOTED     REPEATED TO VERIFY     HCT  Date Value Range Status  04/15/2012 28.3* 36.0 - 46.0 % Final    Physical Exam:  General: alert Lochia: appropriate Uterine Fundus: firm Incision: healing well DVT Evaluation: No evidence of DVT seen on physical exam.  Discharge Diagnoses: Term Pregnancy-delivered  Discharge Information: Date: 04/17/2012 Activity: pelvic rest Diet: routine Medications: Percocet Condition: stable Instructions: refer to practice specific booklet Discharge to: home Follow-up Information    Call in 6 weeks to follow up.   Contact information:   b Meghan Salazar         Newborn Data: Live born female  Birth Weight: 6 lb 4.7 oz (2855 g) APGAR: 8, 9  Home with mother.  Meghan Salazar 04/17/2012, 7:30 AM

## 2012-05-16 NOTE — L&D Delivery Note (Signed)
Delivery Note I was called to assess pt who suddenly developed an urge to push.  She was noted to be C/C/+2.  She pushed once and BOW protruded out of introitus.  AROM with clear fluid.  Over the next push, at 11:29 PM a viable female was delivered via Vaginal, Spontaneous Delivery (Presentation: ; Occiput Anterior).  APGAR: 8, 8; weight pending.  After the delivery of the baby, who was quite vigorous, he was held below the level of the introitus for ~ 45 seconds, dried and stimulated, and the cord milked 3 times towards the umbilicus.  THe cord was doubly clamped and cut, and baby taken to the warmer where the NICU team assumed care.   Placenta status: Intact, Spontaneous.  THe MgSO4 was turned off.  THe uterus had some bogginess to it, so of cytotec was placed PR.  Placenta sent to pathology.  Cord: 3 vessels with the following complications: None.  Cord pH: pending  Anesthesia: None  Episiotomy: None Lacerations: None Suture Repair: n/a Est. Blood Loss (mL): 350 cc  Mom to postpartum.  Baby to NICU.  CRESENZO-DISHMAN,Meghan Salazar 03/07/2013, 11:56 PM

## 2012-05-16 NOTE — L&D Delivery Note (Signed)
Attestation of Attending Supervision of Advanced Practitioner (CNM/NP): Evaluation and management procedures were performed by the Advanced Practitioner under my supervision and collaboration. I have reviewed the Advanced Practitioner's note and chart, and I agree with the management and plan.  [name]. 5:40 AM

## 2012-09-17 ENCOUNTER — Inpatient Hospital Stay (HOSPITAL_COMMUNITY)
Admission: AD | Admit: 2012-09-17 | Discharge: 2012-09-17 | Disposition: A | Discharge status: Home / Self Care | Payer: Medicaid Other | Source: Ambulatory Visit | Attending: Family Medicine | Admitting: Family Medicine

## 2012-09-17 ENCOUNTER — Inpatient Hospital Stay (HOSPITAL_COMMUNITY): Payer: Medicaid Other

## 2012-09-17 ENCOUNTER — Encounter (HOSPITAL_COMMUNITY): Payer: Self-pay

## 2012-09-17 DIAGNOSIS — B9689 Other specified bacterial agents as the cause of diseases classified elsewhere: Secondary | ICD-10-CM | POA: Insufficient documentation

## 2012-09-17 DIAGNOSIS — A499 Bacterial infection, unspecified: Secondary | ICD-10-CM | POA: Insufficient documentation

## 2012-09-17 DIAGNOSIS — O26859 Spotting complicating pregnancy, unspecified trimester: Secondary | ICD-10-CM | POA: Insufficient documentation

## 2012-09-17 DIAGNOSIS — R109 Unspecified abdominal pain: Secondary | ICD-10-CM | POA: Insufficient documentation

## 2012-09-17 DIAGNOSIS — O2390 Unspecified genitourinary tract infection in pregnancy, unspecified trimester: Secondary | ICD-10-CM | POA: Insufficient documentation

## 2012-09-17 DIAGNOSIS — N760 Acute vaginitis: Secondary | ICD-10-CM | POA: Insufficient documentation

## 2012-09-17 DIAGNOSIS — O26851 Spotting complicating pregnancy, first trimester: Secondary | ICD-10-CM

## 2012-09-17 LAB — CBC
Platelets: 199 10*3/uL (ref 150–400)
RBC: 4.34 MIL/uL (ref 3.87–5.11)
RDW: 15.3 % (ref 11.5–15.5)
WBC: 9.3 10*3/uL (ref 4.0–10.5)

## 2012-09-17 LAB — URINALYSIS, ROUTINE W REFLEX MICROSCOPIC
Bilirubin Urine: NEGATIVE
Nitrite: NEGATIVE
Specific Gravity, Urine: 1.025 (ref 1.005–1.030)
Urobilinogen, UA: 1 mg/dL (ref 0.0–1.0)
pH: 6 (ref 5.0–8.0)

## 2012-09-17 LAB — WET PREP, GENITAL: Yeast Wet Prep HPF POC: NONE SEEN

## 2012-09-17 LAB — POCT PREGNANCY, URINE: Preg Test, Ur: POSITIVE — AB

## 2012-09-17 LAB — URINE MICROSCOPIC-ADD ON

## 2012-09-17 LAB — HCG, QUANTITATIVE, PREGNANCY: hCG, Beta Chain, Quant, S: 606 m[IU]/mL — ABNORMAL HIGH (ref <5)

## 2012-09-17 MED ORDER — METRONIDAZOLE 500 MG PO TABS: 500.0000 mg | ORAL_TABLET | Freq: Two times a day (BID) | ORAL | Status: DC

## 2012-09-17 NOTE — MAU Provider Note (Signed)
Chart reviewed and agree with management and plan.  

## 2012-09-17 NOTE — MAU Note (Signed)
Patient states she has had abdominal pain since yesterday with spotting. No bleeding today. Patient states she did 4 pregnancy test yesterday and 2 were positive and 2 negative.

## 2012-09-17 NOTE — MAU Provider Note (Signed)
History     CSN: [id number]  Arrival date and time: 09/17/12 1223   First Provider Initiated Contact with Patient 09/17/12 1336      Chief Complaint  Patient presents with  . Possible Pregnancy  . Abdominal Pain   HPI 27 y.o. G3P2002 at [redacted]w[redacted]d with low abd pain and spotting since yesterday. Pregnancy test + today, did not know she was pregnant, she is surprised and distraught. Has 5 mo at home.   Past Medical History  Diagnosis Date  . Asthma   . Other and unspecified ovarian cysts     History reviewed. No pertinent past surgical history.  Family History  Problem Relation Age of Onset  . Diabetes Maternal Grandmother   . Hypertension Paternal Grandmother   . Diabetes Paternal Grandmother     History  Substance Use Topics  . Smoking status: Former Smoker -- 0.25 packs/day    Types: Cigarettes    Quit date: 07/27/2011  . Smokeless tobacco: Never Used  . Alcohol Use: No    Allergies: No Known Allergies  Prescriptions prior to admission  Medication Sig Dispense Refill  . albuterol (PROVENTIL HFA;VENTOLIN HFA) 108 (90 BASE) MCG/ACT inhaler Inhale 2 puffs into the lungs every 6 (six) hours as needed for wheezing.        Review of Systems  Constitutional: Negative.   Respiratory: Negative.   Cardiovascular: Negative.   Gastrointestinal: Positive for abdominal pain. Negative for nausea, vomiting, diarrhea and constipation.  Genitourinary: Negative for dysuria, urgency, frequency, hematuria and flank pain.       + spotting   Musculoskeletal: Negative.   Neurological: Negative.   Psychiatric/Behavioral: Negative.    Physical Exam   Blood pressure 108/57, pulse 68, temperature 98.5 F (36.9 C), temperature source Oral, resp. rate 16, height 5\' 7"  (1.702 m), weight 196 lb 9.6 oz (89.177 kg), last menstrual period 08/14/2012, SpO2 100.00%.  Physical Exam  Nursing note and vitals reviewed. Constitutional: She is oriented to person, place, and time. She appears  well-developed and well-nourished. No distress.  Cardiovascular: Normal rate.   Respiratory: Effort normal.  GI: Soft. There is no tenderness.  Genitourinary: There is no rash, tenderness or lesion on the right labia. There is no rash, tenderness or lesion on the left labia. Uterus is not tender. Cervix exhibits discharge (yellow). Cervix exhibits no motion tenderness and no friability. Right adnexum displays tenderness. Right adnexum displays no mass and no fullness. Left adnexum displays no mass, no tenderness and no fullness. No bleeding around the vagina. Vaginal discharge (copius yellow) found.  Musculoskeletal: Normal range of motion.  Neurological: She is alert and oriented to person, place, and time.  Skin: Skin is warm and dry.  Psychiatric: Her mood appears anxious.  Tearful     MAU Course  Procedures  Results for orders placed during the hospital encounter of 09/17/12 (from the past 24 hour(s))  URINALYSIS, ROUTINE W REFLEX MICROSCOPIC     Status: Abnormal   Collection Time    09/17/12  1:05 PM      Result Value Range   Color, Urine YELLOW  YELLOW   APPearance CLOUDY (*) CLEAR   Specific Gravity, Urine 1.025  1.005 - 1.030   pH 6.0  5.0 - 8.0   Glucose, UA NEGATIVE  NEGATIVE mg/dL   Hgb urine dipstick TRACE (*) NEGATIVE   Bilirubin Urine NEGATIVE  NEGATIVE   Ketones, ur NEGATIVE  NEGATIVE mg/dL   Protein, ur NEGATIVE  NEGATIVE mg/dL   Urobilinogen, UA  1.0  0.0 - 1.0 mg/dL   Nitrite NEGATIVE  NEGATIVE   Leukocytes, UA LARGE (*) NEGATIVE  URINE MICROSCOPIC-ADD ON     Status: Abnormal   Collection Time    09/17/12  1:05 PM      Result Value Range   Squamous Epithelial / LPF MANY (*) RARE   WBC, UA 21-50  <3 WBC/hpf   Bacteria, UA MANY (*) RARE  POCT PREGNANCY, URINE     Status: Abnormal   Collection Time    09/17/12  1:20 PM      Result Value Range   Preg Test, Ur POSITIVE (*) NEGATIVE  WET PREP, GENITAL     Status: Abnormal   Collection Time    09/17/12  1:30  PM      Result Value Range   Yeast Wet Prep HPF POC NONE SEEN  NONE SEEN   Trich, Wet Prep NONE SEEN  NONE SEEN   Clue Cells Wet Prep HPF POC MODERATE (*) NONE SEEN   WBC, Wet Prep HPF POC MANY (*) NONE SEEN  CBC     Status: None   Collection Time    09/17/12  1:35 PM      Result Value Range   WBC 9.3  4.0 - 10.5 K/uL   RBC 4.34  3.87 - 5.11 MIL/uL   Hemoglobin 12.7  12.0 - 15.0 g/dL   HCT 16.1  09.6 - 04.5 %   MCV 85.9  78.0 - 100.0 fL   MCH 29.3  26.0 - 34.0 pg   MCHC 34.0  30.0 - 36.0 g/dL   RDW 40.9  81.1 - 91.4 %   Platelets 199  150 - 400 K/uL  HCG, QUANTITATIVE, PREGNANCY     Status: Abnormal   Collection Time    09/17/12  1:35 PM      Result Value Range   hCG, Beta Chain, Quant, S 606 (*) <5 mIU/mL   U/S: IUGS, no yolk sac, fetal pole or adnexal mass seen  Assessment and Plan   1. Spotting and cramping complicating pregnancy, antepartum, first trimester   2. BV (bacterial vaginosis)   Flagyl for BV F/U in 48+ hours for repeat quant Precautions rev'd    Medication List    TAKE these medications       albuterol 108 (90 BASE) MCG/ACT inhaler  Commonly known as:  PROVENTIL HFA;VENTOLIN HFA  Inhale 2 puffs into the lungs every 6 (six) hours as needed for wheezing.     metroNIDAZOLE 500 MG tablet  Commonly known as:  FLAGYL  Take 1 tablet (500 mg total) by mouth 2 (two) times daily.            Follow-up Information   Follow up with [facility] [place] [facility]MISS[facility] On 09/20/2012. (for repeat labs)    Contact information:   [address] [id number] [place] [address] [phone]        [facility] 09/17/2012, 2:50 PM

## 2012-09-18 LAB — URINE CULTURE
Colony Count: NO GROWTH
Culture: NO GROWTH

## 2012-09-18 LAB — GC/CHLAMYDIA PROBE AMP
CT Probe RNA: POSITIVE — AB
GC Probe RNA: NEGATIVE

## 2012-09-20 ENCOUNTER — Encounter (HOSPITAL_COMMUNITY): Payer: Self-pay | Admitting: *Deleted

## 2012-09-21 ENCOUNTER — Inpatient Hospital Stay (HOSPITAL_COMMUNITY)
Admission: AD | Admit: 2012-09-21 | Discharge: 2012-09-21 | Disposition: A | Discharge status: Home / Self Care | Payer: Medicaid Other | Attending: Obstetrics & Gynecology | Admitting: Obstetrics & Gynecology

## 2012-09-21 ENCOUNTER — Encounter (HOSPITAL_COMMUNITY): Payer: Self-pay | Admitting: Obstetrics and Gynecology

## 2012-09-21 DIAGNOSIS — O26859 Spotting complicating pregnancy, unspecified trimester: Secondary | ICD-10-CM | POA: Insufficient documentation

## 2012-09-21 DIAGNOSIS — R109 Unspecified abdominal pain: Secondary | ICD-10-CM | POA: Insufficient documentation

## 2012-09-21 DIAGNOSIS — O209 Hemorrhage in early pregnancy, unspecified: Secondary | ICD-10-CM

## 2012-09-21 NOTE — MAU Provider Note (Signed)
No chief complaint on file.   Subjective Meghan Salazar 27 y.o.  [id number] at [redacted]w[redacted]d by LMP presents for F/U quant. Initially seen in MAU 09/17/12 for spotting and cramping. Now has no VB but is still having suprapubic abdominal cramps. [place] 4 days ago showed IUGS, no GS, no fetal pole, no adnexal mass. Quant was 606. Tx'd BV.  She is definite about plans to terminate the pregnancy. Declines viability [place].  Blood type: B+  Pertinent Medical History: asthma Pertinent Ob/Gyn History: NSVD x 2 Pertinent Surgical History: none Prescriptions prior to admission  Medication Sig Dispense Refill  . albuterol (PROVENTIL HFA;VENTOLIN HFA) 108 (90 BASE) MCG/ACT inhaler Inhale 2 puffs into the lungs every 6 (six) hours as needed for wheezing.      . metroNIDAZOLE (FLAGYL) 500 MG tablet Take 1 tablet (500 mg total) by mouth 2 (two) times daily.  14 tablet  0    No Known Allergies   Objective  There were no vitals filed for this visit.   Physical Exam General: WN/WD in NAD  Abdom: soft, NT  Lab Results Results for orders placed during the hospital encounter of 09/21/12 (from the past 24 hour(s))  HCG, QUANTITATIVE, PREGNANCY     Status: Abnormal   Collection Time    09/21/12 12:31 PM      Result Value Range   hCG, Beta Chain, Quant, S 3931 (*) <5 mIU/mL    MAU Course Offerred USand explained it is uncertain whether pregnancy is intrauterine or ectopic. Understands and accepts risks.  Discussed social situation. Has 28 month old, unsupportive partner.   Assessment 1. Bleeding in early pregnancy   Appropriate rise in quantitative beta hCG, but ectopic not ruled out   Plan    Dr. Penne Lash aware of disposition with pt. declining [place] Note to WOC to schedule for contraceptive visit in 3-4 wks.  Discharge home See AVS for pt education   Medication List    TAKE these medications       albuterol 108 (90 BASE) MCG/ACT inhaler  Commonly known as:  PROVENTIL HFA;VENTOLIN HFA  Inhale 2 puffs into  the lungs every 6 (six) hours as needed for wheezing.     metroNIDAZOLE 500 MG tablet  Commonly known as:  FLAGYL  Take 1 tablet (500 mg total) by mouth 2 (two) times daily.           Follow-up Information   Schedule an appointment as soon as possible for a visit to follow up. (Call as soon as possible for your appointment)    Contact information:   [facility]       Dajanae Brophy 09/21/2012 1:35 PM

## 2012-09-21 NOTE — MAU Provider Note (Signed)
Pt to sign against medical advise form.  CNM Poe aware of need for AMA form.  Pt plans for termination.

## 2012-09-25 NOTE — MAU Note (Signed)
Pt called in received a letter to call MAU for lab results as they had been unable to reach by phone.  Discussed results, pt has an appt today- will get it treated.

## 2012-10-13 ENCOUNTER — Inpatient Hospital Stay (HOSPITAL_COMMUNITY)
Admission: AD | Admit: 2012-10-13 | Discharge: 2012-10-13 | Disposition: A | Discharge status: Home / Self Care | Payer: Medicaid Other | Source: Ambulatory Visit | Attending: Obstetrics and Gynecology | Admitting: Obstetrics and Gynecology

## 2012-10-13 ENCOUNTER — Encounter (HOSPITAL_COMMUNITY): Payer: Self-pay | Admitting: *Deleted

## 2012-10-13 DIAGNOSIS — F439 Reaction to severe stress, unspecified: Secondary | ICD-10-CM

## 2012-10-13 DIAGNOSIS — M549 Dorsalgia, unspecified: Secondary | ICD-10-CM | POA: Insufficient documentation

## 2012-10-13 DIAGNOSIS — O219 Vomiting of pregnancy, unspecified: Secondary | ICD-10-CM

## 2012-10-13 DIAGNOSIS — R109 Unspecified abdominal pain: Secondary | ICD-10-CM | POA: Insufficient documentation

## 2012-10-13 DIAGNOSIS — O211 Hyperemesis gravidarum with metabolic disturbance: Secondary | ICD-10-CM | POA: Insufficient documentation

## 2012-10-13 DIAGNOSIS — E860 Dehydration: Secondary | ICD-10-CM | POA: Insufficient documentation

## 2012-10-13 LAB — URINALYSIS, ROUTINE W REFLEX MICROSCOPIC
Glucose, UA: NEGATIVE mg/dL
Hgb urine dipstick: NEGATIVE
Ketones, ur: NEGATIVE mg/dL
Leukocytes, UA: NEGATIVE
pH: 6 (ref 5.0–8.0)

## 2012-10-13 MED ORDER — ONDANSETRON HCL 4 MG PO TABS: 4.0000 mg | ORAL_TABLET | Freq: Three times a day (TID) | ORAL | Status: DC | PRN

## 2012-10-13 MED ORDER — PROMETHAZINE HCL 25 MG/ML IJ SOLN
25.0000 mg | Freq: Once | INTRAVENOUS | Status: AC
  Administered 2012-10-13: 25 mg via INTRAVENOUS
  Filled 2012-10-13: qty 1

## 2012-10-13 MED ORDER — DEXTROSE 5 % IN LACTATED RINGERS IV BOLUS
500.0000 mL | Freq: Once | INTRAVENOUS | Status: AC
  Administered 2012-10-13: 1000 mL via INTRAVENOUS

## 2012-10-13 NOTE — MAU Note (Signed)
When RN goes in to begin IV fluids pt is sobbing. Voices feeling helpless about having another baby now. States she had planned to terminate but doesn't feel like she can do that because she feels like it is unforgivable and that God will not forgive her. States she doesn't feel like she can care for another child at this time (has a 34 month old and a 26 year old at home). States her power is scheduled to be disconnected Monday and she just feels like she can't care for another child at this time. Pt is sobbing through all this. States she doesn't feel like she has any options. Reassurance given. Same reported to provider, will do a social services consult.

## 2012-10-13 NOTE — MAU Note (Signed)
Pt presents per EMS with complaint of back pain, stomach pain, vomiting all day today.

## 2012-10-13 NOTE — MAU Provider Note (Signed)
History     CSN: [id number]  Arrival date and time: 10/13/12 4098   First Provider Initiated Contact with Patient 10/13/12 [phone]      Chief Complaint  Patient presents with  . Back Pain  . Abdominal Pain  . Emesis During Pregnancy   HPI Ms. DORCAS MELITO is a 27 y.o. G3P2002 at [redacted]w[redacted]d who presents to MAU by EMS today with complaint of excessive N/V today. The patient also complains of low back pain and mid-abdominal pain since this afternoon. She states that the N/V just started this morning. She had diarrhea earlier in the week. She denies fever or sick contacts. The patient was seen and confirmed IUP earlier this month. Last note states that patient is considering termination. She is tearful here today because she does not want to terminate, but financially she feels that she cannot have another child at this time. She states that she was on Depo and used a condom and still got pregnant. The patient also states that she noticed a small pink strip when she wiped recently.   OB History   Grav Para Term Preterm Abortions TAB SAB Ect Mult Living   3 2 2       2       Past Medical History  Diagnosis Date  . Asthma   . Other and unspecified ovarian cysts     History reviewed. No pertinent past surgical history.  Family History  Problem Relation Age of Onset  . Diabetes Maternal Grandmother   . Hypertension Paternal Grandmother   . Diabetes Paternal Grandmother     History  Substance Use Topics  . Smoking status: Former Smoker -- 0.25 packs/day    Types: Cigarettes    Quit date: 07/27/2011  . Smokeless tobacco: Never Used  . Alcohol Use: No    Allergies: No Known Allergies  No prescriptions prior to admission    Review of Systems  Constitutional: Negative for fever and malaise/fatigue.  Gastrointestinal: Positive for nausea, vomiting, abdominal pain and diarrhea. Negative for constipation.  Genitourinary: Negative for dysuria, urgency and frequency.  Musculoskeletal:  Positive for back pain.   Physical Exam   Blood pressure 121/58, pulse 63, temperature 97.7 F (36.5 C), temperature source Oral, resp. rate 18, height 5\' 7"  (1.702 m), weight 200 lb (90.719 kg), last menstrual period 08/14/2012, SpO2 99.00%.  Physical Exam  Constitutional: She is oriented to person, place, and time. She appears well-developed and well-nourished. No distress.  HENT:  Head: Normocephalic and atraumatic.  Cardiovascular: Normal rate.   Respiratory: Effort normal.  GI: Soft.  Neurological: She is alert and oriented to person, place, and time.  Skin: Skin is warm and dry. No erythema.  Psychiatric: She has a normal mood and affect.   Results for orders placed during the hospital encounter of 10/13/12 (from the past 24 hour(s))  URINALYSIS, ROUTINE W REFLEX MICROSCOPIC     Status: Abnormal   Collection Time    10/13/12  2:03 AM      Result Value Range   Color, Urine YELLOW  YELLOW   APPearance CLEAR  CLEAR   Specific Gravity, Urine >1.030 (*) 1.005 - 1.030   pH 6.0  5.0 - 8.0   Glucose, UA NEGATIVE  NEGATIVE mg/dL   Hgb urine dipstick NEGATIVE  NEGATIVE   Bilirubin Urine NEGATIVE  NEGATIVE   Ketones, ur NEGATIVE  NEGATIVE mg/dL   Protein, ur NEGATIVE  NEGATIVE mg/dL   Urobilinogen, UA 0.2  0.0 - 1.0 mg/dL  Nitrite NEGATIVE  NEGATIVE   Leukocytes, UA NEGATIVE  NEGATIVE    MAU Course  Procedures None  MDM UA shows signs of mild dehydration. Patient is continue to cough and gag excessively after admission to MAU. She has not actually vomited. She appears uncomfortable.  1 L IV LR with phenergan infusion Patient has told RN that she had to sell her car to pay rent and is about to have her power shut off on Monday. She is supposed to start a new job soon, but won't be able to with pregnancy and 2 small children. Patient is visibly upset by her situation. We will re-assess for medical issues and may keep patient until morning for SW consult.  0700 - Patient has  been asleep since approximately 0500. IV fluids. Waiting for SW to respond to page.   Assessment and Plan  0800 - Patent awaiting SW. Care turned over to Wynelle Bourgeois, CNM  Freddi Starr, PA-C  10/13/2012, 3:33 AM   Has been sleeping. No vomiting Seen by Technical brewer Will discharge home Rx Zofran

## 2012-10-13 NOTE — MAU Note (Signed)
Repeat page to social worker by Georgiana Shore RN.

## 2012-10-13 NOTE — Progress Notes (Signed)
CSW called by MAU staff stating patient wanted resources on adoption.  CSW met with patient, who spoke very openly with CSW.  She states she has a 26 year old and a 65 month old at home and she knows that she cannot parent this child.  She also states that she has an open CPS case from January and that a worker was scheduled to do a visit with her at 11am today.  CSW spoke with the on call CPS worker requesting that she attempt to get in touch with patient's worker to inform her that patient is in the hospital.  Patient does not want CPS worker to think she is being non-compliant by not being home for scheduled appointment.  CSW provided education on adoption as one of her options.  MOB states she knows her other option is termination.  She states she could never carry a baby to term and then give it away and she states she knows she cannot care for another child at this time.  She is deeply hurt by her decision to terminate and states she hopes God will not hate her for it.  CSW recommends she speak with a Chaplain and she states she has already asked to and that one is on the way.  CSW recommends outpatient counseling to help patient cope with her decision not to parent this child.  She is in agreement that this will be beneficial.  Given that it is a weekend, CSW could not make her an appointment, but left a message on the referral line at [facility] requesting that they call patient on Monday.  CSW made referral here because they have funds to work with patients without insurance.  Patient states she starts a job on Monday, which CSW congratulated her for, but that she is having trouble paying her bills at this time.  CSW suggested she call [facility] to see if they have any emergency funds at this time.  She states she has received assistance from them before and does not need assistance with this.  She states the assistance was a long time ago, so she should be eligible for assistance at  this time if funds are available.  CSW thanked patient for talking today.  Patient thanked CSW and seemed appreciative of the visit.
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
## 2012-10-14 NOTE — MAU Provider Note (Signed)
Attestation of Attending Supervision of Advanced Practitioner: Evaluation and management procedures were performed by the PA/NP/CNM/OB Fellow under my supervision/collaboration. Chart reviewed and agree with management and plan.  Tilda Burrow 10/14/2012 6:48 PM

## 2012-10-25 ENCOUNTER — Encounter: Payer: Self-pay | Admitting: Obstetrics & Gynecology

## 2012-10-25 ENCOUNTER — Encounter: Payer: Medicaid Other | Admitting: Obstetrics and Gynecology

## 2012-11-28 ENCOUNTER — Inpatient Hospital Stay (HOSPITAL_COMMUNITY)
Admission: AD | Admit: 2012-11-28 | Discharge: 2012-11-28 | Disposition: A | Discharge status: Home / Self Care | Payer: Medicaid Other | Source: Ambulatory Visit | Attending: Obstetrics & Gynecology | Admitting: Obstetrics & Gynecology

## 2012-11-28 ENCOUNTER — Encounter (HOSPITAL_COMMUNITY): Payer: Self-pay | Admitting: *Deleted

## 2012-11-28 DIAGNOSIS — O209 Hemorrhage in early pregnancy, unspecified: Secondary | ICD-10-CM | POA: Insufficient documentation

## 2012-11-28 DIAGNOSIS — O4690 Antepartum hemorrhage, unspecified, unspecified trimester: Secondary | ICD-10-CM

## 2012-11-28 DIAGNOSIS — O4692 Antepartum hemorrhage, unspecified, second trimester: Secondary | ICD-10-CM

## 2012-11-28 LAB — URINALYSIS, ROUTINE W REFLEX MICROSCOPIC
Glucose, UA: NEGATIVE mg/dL
Hgb urine dipstick: NEGATIVE
Ketones, ur: NEGATIVE mg/dL
Protein, ur: NEGATIVE mg/dL

## 2012-11-28 LAB — URINE MICROSCOPIC-ADD ON
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
## 2012-11-28 NOTE — Progress Notes (Signed)
Plans to go to Dr. Gaynell Face. No appointment yet, waiting on medicaid.

## 2012-11-28 NOTE — MAU Note (Signed)
Patient states last night she had a "lump, like a blood clot" that fell out. No bleeding after and none now. States she has a little abdominal cramping. Has not started prenatal care.

## 2012-11-28 NOTE — Progress Notes (Signed)
Patient sobbing uncontrollably. States FOB is not going to help her. States she cannot do this alone.  Ramond Dial, SW contacted (left message) to come and see patient. Paient informed SW requested.  Attempted to comfort patient. Allowed patient to ventilate feelings. Tissues given. Water given. Patient inconsolable.
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
## 2012-12-11 ENCOUNTER — Inpatient Hospital Stay (HOSPITAL_COMMUNITY)
Admission: AD | Admit: 2012-12-11 | Discharge: 2012-12-11 | Disposition: A | Discharge status: Home / Self Care | Payer: Medicaid Other | Source: Ambulatory Visit | Attending: Obstetrics and Gynecology | Admitting: Obstetrics and Gynecology

## 2012-12-11 ENCOUNTER — Inpatient Hospital Stay (HOSPITAL_COMMUNITY): Payer: Medicaid Other

## 2012-12-11 ENCOUNTER — Encounter (HOSPITAL_COMMUNITY): Payer: Self-pay | Admitting: *Deleted

## 2012-12-11 DIAGNOSIS — O4692 Antepartum hemorrhage, unspecified, second trimester: Secondary | ICD-10-CM

## 2012-12-11 DIAGNOSIS — O209 Hemorrhage in early pregnancy, unspecified: Secondary | ICD-10-CM | POA: Insufficient documentation

## 2012-12-11 DIAGNOSIS — R109 Unspecified abdominal pain: Secondary | ICD-10-CM | POA: Insufficient documentation

## 2012-12-11 DIAGNOSIS — O9989 Other specified diseases and conditions complicating pregnancy, childbirth and the puerperium: Secondary | ICD-10-CM

## 2012-12-11 LAB — URINALYSIS, ROUTINE W REFLEX MICROSCOPIC
Bilirubin Urine: NEGATIVE
Nitrite: NEGATIVE
Protein, ur: 100 mg/dL — AB
Specific Gravity, Urine: 1.02 (ref 1.005–1.030)
Urobilinogen, UA: 0.2 mg/dL (ref 0.0–1.0)

## 2012-12-11 LAB — URINE MICROSCOPIC-ADD ON

## 2012-12-11 MED ORDER — OXYCODONE-ACETAMINOPHEN 5-325 MG PO TABS: 1.0000 | ORAL_TABLET | ORAL | Status: DC | PRN

## 2012-12-11 MED ORDER — LACTATED RINGERS IV SOLN
INTRAVENOUS | Status: DC
  Administered 2012-12-11: 11:00:00 via INTRAVENOUS

## 2012-12-11 MED ORDER — HYDROMORPHONE HCL PF 1 MG/ML IJ SOLN
1.0000 mg | Freq: Once | INTRAMUSCULAR | Status: AC
  Administered 2012-12-11: 1 mg via INTRAVENOUS
  Filled 2012-12-11: qty 1

## 2012-12-11 NOTE — MAU Note (Signed)
States she started feeling bad last night. Pressure in her groin. Started feeling pain this AM. Started bleeding. Thinks she passed some fluid and tissue. States pain comes and goes. Patient pushing at intervals. No bleeding noted at this time. ? Fluid on pad.

## 2012-12-11 NOTE — MAU Provider Note (Signed)
History     CSN: [id number]  Arrival date and time: 12/11/12 1012   First Provider Initiated Contact with Patient 12/11/12 1033      No chief complaint on file.  HPI Ms. WALTERINE AMODEI is a 27 y.o. G3P2002 at [redacted]w[redacted]d who presents to MAU today by EMS with severe lower abdominal pain, back pain and vaginal bleeding. The patient states that the pain started last night and she thought she might have a UTI. She noticed a small amount of bleeding earlier this morning that progressed to heavier bleeding prior to arrival. The patient denies N/V or fever. She states that she did pass some tissue that looked "like chicken meat."   OB History   Grav Para Term Preterm Abortions TAB SAB Ect Mult Living   3 2 2       2       Past Medical History  Diagnosis Date  . Asthma   . Other and unspecified ovarian cysts     No past surgical history on file.  Family History  Problem Relation Age of Onset  . Diabetes Maternal Grandmother   . Hypertension Paternal Grandmother   . Diabetes Paternal Grandmother     History  Substance Use Topics  . Smoking status: Former Smoker -- 0.25 packs/day    Types: Cigarettes    Quit date: 07/27/2011  . Smokeless tobacco: Never Used  . Alcohol Use: No    Allergies: No Known Allergies  Prescriptions prior to admission  Medication Sig Dispense Refill  . albuterol (PROVENTIL HFA;VENTOLIN HFA) 108 (90 BASE) MCG/ACT inhaler Inhale 2 puffs into the lungs every 6 (six) hours as needed for wheezing (rescue inhaler).      . Prenatal Vit-Fe Fumarate-FA (PRENATAL MULTIVITAMIN) TABS Take 1 tablet by mouth daily at 12 noon.        Review of Systems  Constitutional: Negative for fever and malaise/fatigue.  Gastrointestinal: Positive for abdominal pain. Negative for nausea and vomiting.  Genitourinary:       + vaginal bleeding  Musculoskeletal: Positive for back pain.   Physical Exam   Last menstrual period 08/14/2012.  Physical Exam  Constitutional: She is  oriented to person, place, and time. She appears well-developed and well-nourished. She appears distressed.  Patient appears very uncomfortable upon admission. Continues to bear down intermittently secondary to pain  HENT:  Head: Normocephalic and atraumatic.  Cardiovascular: Normal rate.   Respiratory: Effort normal.  GI: Soft. She exhibits no distension and no mass. There is tenderness (moderate tenderness of the lower abdomen). There is no rebound and no guarding.  Genitourinary: Uterus is enlarged (appropriate for GA). Uterus is not tender. Cervix exhibits no motion tenderness, no discharge and no friability. No bleeding around the vagina. Vaginal discharge (small amount of thin, white discharge noted) found.  Neurological: She is alert and oriented to person, place, and time.  Skin: Skin is warm and dry. No erythema.  Psychiatric: She has a normal mood and affect.   Results for orders placed during the hospital encounter of 12/11/12 (from the past 24 hour(s))  URINALYSIS, ROUTINE W REFLEX MICROSCOPIC     Status: Abnormal   Collection Time    12/11/12 11:23 AM      Result Value Range   Color, Urine YELLOW  YELLOW   APPearance CLOUDY (*) CLEAR   Specific Gravity, Urine 1.020  1.005 - 1.030   pH 8.0  5.0 - 8.0   Glucose, UA NEGATIVE  NEGATIVE mg/dL   Hgb urine  dipstick LARGE (*) NEGATIVE   Bilirubin Urine NEGATIVE  NEGATIVE   Ketones, ur NEGATIVE  NEGATIVE mg/dL   Protein, ur 161 (*) NEGATIVE mg/dL   Urobilinogen, UA 0.2  0.0 - 1.0 mg/dL   Nitrite NEGATIVE  NEGATIVE   Leukocytes, UA SMALL (*) NEGATIVE  URINE MICROSCOPIC-ADD ON     Status: Abnormal   Collection Time    12/11/12 11:23 AM      Result Value Range   Squamous Epithelial / LPF FEW (*) RARE   WBC, UA 21-50  <3 WBC/hpf   RBC / HPF 11-20  <3 RBC/hpf   Bacteria, UA FEW (*) RARE     MAU Course  Procedures None  MDM 1 mg IV Dilaudid given in MAU - patient reports improvement in symptoms Urine culture  pending Patient was considering TAB, but has decided against this now. Patient has numerous visits in MAU. Last time patient was here and seen by me had extensive SW consult.  Assessment and Plan  A: Abdominal pain in pregnancy, second trimester  P: Discharge home Rx for Percocet given to patient PRN pain Bleeding precautions discussed Patient advised to start care with Dr. Gaynell Face as planned ASAP Patient may return to MAU as needed or if her condition were to change or worsen  Freddi Starr, PA-C   12/11/2012, 10:33 AM

## 2012-12-11 NOTE — Progress Notes (Signed)
Patient called out with spotting noted when she got up to get dressed. Reasurred patient that spotting is most likely from exam and U/S. Reassurred patient again that U/S was normal, that her baby appears healthy.

## 2012-12-13 ENCOUNTER — Inpatient Hospital Stay (HOSPITAL_COMMUNITY)
Admission: AD | Admit: 2012-12-13 | Discharge: 2012-12-15 | DRG: 781 | Disposition: A | Discharge status: Home / Self Care | Payer: Medicaid Other | Source: Ambulatory Visit | Attending: Obstetrics & Gynecology | Admitting: Obstetrics & Gynecology

## 2012-12-13 ENCOUNTER — Encounter (HOSPITAL_COMMUNITY): Payer: Self-pay | Admitting: *Deleted

## 2012-12-13 DIAGNOSIS — N12 Tubulo-interstitial nephritis, not specified as acute or chronic: Secondary | ICD-10-CM | POA: Diagnosis present

## 2012-12-13 DIAGNOSIS — B958 Unspecified staphylococcus as the cause of diseases classified elsewhere: Secondary | ICD-10-CM | POA: Diagnosis present

## 2012-12-13 DIAGNOSIS — O9989 Other specified diseases and conditions complicating pregnancy, childbirth and the puerperium: Secondary | ICD-10-CM

## 2012-12-13 DIAGNOSIS — R32 Unspecified urinary incontinence: Secondary | ICD-10-CM | POA: Diagnosis present

## 2012-12-13 DIAGNOSIS — O2390 Unspecified genitourinary tract infection in pregnancy, unspecified trimester: Principal | ICD-10-CM | POA: Diagnosis present

## 2012-12-13 DIAGNOSIS — O2302 Infections of kidney in pregnancy, second trimester: Secondary | ICD-10-CM | POA: Diagnosis present

## 2012-12-13 HISTORY — DX: Unspecified abnormal cytological findings in specimens from cervix uteri: R87.619

## 2012-12-13 HISTORY — DX: Unspecified infectious disease: B99.9

## 2012-12-13 HISTORY — DX: Reserved for concepts with insufficient information to code with codable children: IMO0002

## 2012-12-13 LAB — CBC
MCH: 31.1 pg (ref 26.0–34.0)
MCHC: 35.5 g/dL (ref 30.0–36.0)
MCV: 87.6 fL (ref 78.0–100.0)
Platelets: 166 10*3/uL (ref 150–400)
RBC: 3.63 MIL/uL — ABNORMAL LOW (ref 3.87–5.11)
RDW: 13.8 % (ref 11.5–15.5)

## 2012-12-13 LAB — URINE CULTURE: Colony Count: >=100000

## 2012-12-13 MED ORDER — PRENATAL MULTIVITAMIN CH: 1.0000 | ORAL_TABLET | Freq: Every day | ORAL | Status: DC

## 2012-12-13 MED ORDER — ONDANSETRON HCL 4 MG/2ML IJ SOLN
4.0000 mg | Freq: Four times a day (QID) | INTRAMUSCULAR | Status: DC | PRN
  Administered 2012-12-13 – 2012-12-14 (×2): 4 mg via INTRAVENOUS
  Filled 2012-12-13 (×2): qty 2

## 2012-12-13 MED ORDER — OXYCODONE-ACETAMINOPHEN 5-325 MG PO TABS
1.0000 | ORAL_TABLET | ORAL | Status: DC | PRN
Start: 2012-12-13 — End: 2012-12-15
  Administered 2012-12-13 – 2012-12-14 (×5): 2 via ORAL
  Filled 2012-12-13 (×5): qty 2

## 2012-12-13 MED ORDER — ZOLPIDEM TARTRATE 5 MG PO TABS: 5.0000 mg | ORAL_TABLET | Freq: Every evening | ORAL | Status: DC | PRN

## 2012-12-13 MED ORDER — ACETAMINOPHEN 500 MG PO TABS: 1000.0000 mg | ORAL_TABLET | ORAL | Status: DC | PRN

## 2012-12-13 MED ORDER — PHENAZOPYRIDINE HCL 100 MG PO TABS
200.0000 mg | ORAL_TABLET | Freq: Once | ORAL | Status: AC
  Administered 2012-12-13: 200 mg via ORAL
  Filled 2012-12-13: qty 2

## 2012-12-13 MED ORDER — HYDROMORPHONE HCL PF 1 MG/ML IJ SOLN: 0.2000 mg | INTRAMUSCULAR | Status: DC | PRN

## 2012-12-13 MED ORDER — DEXTROSE 5 % IV SOLN
1.0000 g | Freq: Two times a day (BID) | INTRAVENOUS | Status: DC
  Administered 2012-12-13 – 2012-12-15 (×4): 1 g via INTRAVENOUS
  Filled 2012-12-13 (×5): qty 10

## 2012-12-13 MED ORDER — LACTATED RINGERS IV SOLN
INTRAVENOUS | Status: DC
  Administered 2012-12-13 (×2): via INTRAVENOUS

## 2012-12-13 MED ORDER — DOCUSATE SODIUM 100 MG PO CAPS: 100.0000 mg | ORAL_CAPSULE | Freq: Two times a day (BID) | ORAL | Status: DC | PRN

## 2012-12-13 MED ORDER — ALBUTEROL SULFATE HFA 108 (90 BASE) MCG/ACT IN AERS
2.0000 | INHALATION_SPRAY | Freq: Four times a day (QID) | RESPIRATORY_TRACT | Status: DC | PRN
  Filled 2012-12-13: qty 6.7

## 2012-12-13 MED ORDER — ONDANSETRON HCL 4 MG PO TABS: 4.0000 mg | ORAL_TABLET | Freq: Four times a day (QID) | ORAL | Status: DC | PRN

## 2012-12-13 NOTE — MAU Provider Note (Signed)
History     CSN: [id number]  Arrival date and time: 12/13/12 1208   None     Chief Complaint  Patient presents with  . Abdominal Pain  . Back Pain  . Urinary Incontinence   HPI Pt is [redacted]w[redacted]d pregnant and presents with lower abdominal pain and back pain.  Pt was here 2 days ago and had hematuria with pain and pressure with urination and incontinence.  Pt's pain has progressively gotten worse. Review of cath urine culture results from 12/11/2012 >100,000 colonies of Staph. Pt had +Chlamydia in may- has not had TOC  Pt arrived by EMS, sobbing was here 2 days ago, and we did nothing. Knows there has to be something wrong. Lower abd pain for 2 wks, now having back pain. Pain and pressure after urination, feels like she needs to push after she goes. No longer bleeding. Pt is now incontinent- can't hold urine  Revision History...      Past Medical History  Diagnosis Date  . Asthma   . Other and unspecified ovarian cysts   . Infection     uti  . Abnormal Pap smear     biopsy    Past Surgical History  Procedure Laterality Date  . No past surgeries      Family History  Problem Relation Age of Onset  . Diabetes Maternal Grandmother   . Hypertension Paternal Grandmother   . Diabetes Paternal Grandmother     History  Substance Use Topics  . Smoking status: Former Smoker -- 0.25 packs/day    Types: Cigarettes    Quit date: 07/27/2011  . Smokeless tobacco: Never Used  . Alcohol Use: No    Allergies: No Known Allergies  Prescriptions prior to admission  Medication Sig Dispense Refill  . albuterol (PROVENTIL HFA;VENTOLIN HFA) 108 (90 BASE) MCG/ACT inhaler Inhale 2 puffs into the lungs every 6 (six) hours as needed for wheezing (rescue inhaler).      Marland Kitchen oxyCODONE-acetaminophen (PERCOCET/ROXICET) 5-325 MG per tablet Take 1-2 tablets by mouth every 4 (four) hours as needed for pain.  15 tablet  0  . Prenatal Vit-Fe Fumarate-FA (PRENATAL MULTIVITAMIN) TABS Take 1 tablet by mouth  daily at 12 noon.        Review of Systems  Gastrointestinal: Positive for nausea and abdominal pain. Negative for diarrhea and constipation.  Genitourinary: Positive for dysuria, urgency, frequency, hematuria and flank pain.   Physical Exam   Blood pressure 114/71, pulse 75, temperature 97.9 F (36.6 C), temperature source Oral, resp. rate 18, last menstrual period 08/14/2012.  Physical Exam  Nursing note and vitals reviewed. Constitutional: She is oriented to person, place, and time. She appears well-developed and well-nourished.  tearful  HENT:  Head: Normocephalic.  Eyes: Pupils are equal, round, and reactive to light.  Neck: Normal range of motion. Neck supple.  Cardiovascular: Normal rate.   Respiratory: Effort normal.  GI: Soft. She exhibits no distension. There is tenderness. There is guarding. There is no rebound.  FHTs obtained with doppler  Musculoskeletal: Normal range of motion.  Neurological: She is alert and oriented to person, place, and time.  Skin: Skin is warm and dry.  Psychiatric:  tearful    MAU Course  Procedures  Results for orders placed during the hospital encounter of 12/11/12 (from the past 72 hour(s))  URINALYSIS, ROUTINE W REFLEX MICROSCOPIC     Status: Abnormal   Collection Time    12/11/12 11:23 AM      Result Value Range  Color, Urine YELLOW  YELLOW   APPearance CLOUDY (*) CLEAR   Specific Gravity, Urine 1.020  1.005 - 1.030   pH 8.0  5.0 - 8.0   Glucose, UA NEGATIVE  NEGATIVE mg/dL   Hgb urine dipstick LARGE (*) NEGATIVE   Bilirubin Urine NEGATIVE  NEGATIVE   Ketones, ur NEGATIVE  NEGATIVE mg/dL   Protein, ur 161 (*) NEGATIVE mg/dL   Urobilinogen, UA 0.2  0.0 - 1.0 mg/dL   Nitrite NEGATIVE  NEGATIVE   Leukocytes, UA SMALL (*) NEGATIVE  URINE MICROSCOPIC-ADD ON     Status: Abnormal   Collection Time    12/11/12 11:23 AM      Result Value Range   Squamous Epithelial / LPF FEW (*) RARE   WBC, UA 21-50  <3 WBC/hpf   RBC / HPF  11-20  <3 RBC/hpf   Bacteria, UA FEW (*) RARE   Comment: FEW  URINE CULTURE     Status: None   Collection Time    12/11/12 11:23 AM      Result Value Range   Specimen Description URINE, CLEAN CATCH     Special Requests NONE     Culture  Setup Time 12/11/2012 16:11     Colony Count >=100,000 COLONIES/ML     Culture       Value: STAPHYLOCOCCUS SPECIES (COAGULASE NEGATIVE)     Note: RIFAMPIN AND GENTAMICIN SHOULD NOT BE USED AS SINGLE DRUGS FOR TREATMENT OF STAPH INFECTIONS.   Report Status 12/13/2012 FINAL     Organism ID, Bacteria STAPHYLOCOCCUS SPECIES (COAGULASE NEGATIVE)    CBC ordered Pyridium ordered TOC culture for Chlamydia Discussed with Dr. Macon Large- will admit for possible pyelonephritis Assessment and Plan  Abd pain in pregnancy  UTI, possible Pyelonephritis Admit to 3rd floor for IV antibiotics  Meghan Salazar 12/13/2012, 12:34 PM

## 2012-12-13 NOTE — MAU Note (Addendum)
Pt arrived by EMS, sobbing was here 2 days ago, and we did nothing.  Knows there has to be something wrong.  Lower abd pain for 2 wks, now having back pain.  Pain and pressure after urination, feels like she needs to push after she goes.  No longer bleeding.  Pt is now incontinent- can't hold urine
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
## 2012-12-13 NOTE — MAU Provider Note (Signed)
Attestation of Attending Supervision of Advanced Practitioner (PA/CNM/NP): Evaluation and management procedures were performed by the Advanced Practitioner under my supervision and collaboration.  I have reviewed the Advanced Practitioner's note and chart, and I agree with the management and plan.  Paidyn Mcferran, MD, FACOG Attending Obstetrician & Gynecologist Faculty Practice, [facility]  

## 2012-12-14 DIAGNOSIS — N12 Tubulo-interstitial nephritis, not specified as acute or chronic: Secondary | ICD-10-CM

## 2012-12-14 DIAGNOSIS — B958 Unspecified staphylococcus as the cause of diseases classified elsewhere: Secondary | ICD-10-CM

## 2012-12-14 DIAGNOSIS — O2390 Unspecified genitourinary tract infection in pregnancy, unspecified trimester: Secondary | ICD-10-CM

## 2012-12-14 DIAGNOSIS — R32 Unspecified urinary incontinence: Secondary | ICD-10-CM

## 2012-12-14 LAB — CBC WITH DIFFERENTIAL/PLATELET
HCT: 28.4 % — ABNORMAL LOW (ref 36.0–46.0)
Hemoglobin: 10.2 g/dL — ABNORMAL LOW (ref 12.0–15.0)
Lymphs Abs: 1.9 10*3/uL (ref 0.7–4.0)
MCH: 31.4 pg (ref 26.0–34.0)
Monocytes Relative: 7 % (ref 3–12)
Neutro Abs: 3.9 10*3/uL (ref 1.7–7.7)
Neutrophils Relative %: 61 % (ref 43–77)
RBC: 3.25 MIL/uL — ABNORMAL LOW (ref 3.87–5.11)

## 2012-12-14 LAB — GC/CHLAMYDIA PROBE AMP: CT Probe RNA: NEGATIVE

## 2012-12-14 MED ORDER — SODIUM CHLORIDE 0.9 % IV SOLN
INTRAVENOUS | Status: DC
  Administered 2012-12-14 – 2012-12-15 (×3): via INTRAVENOUS

## 2012-12-14 NOTE — Progress Notes (Signed)
UR completed 

## 2012-12-14 NOTE — Progress Notes (Signed)
Subjective: low back pain, no fetal movement Patient reports no problems voiding.    Objective: I have reviewed patient's vital signs, medications and labs. Filed Vitals:   12/13/12 1500 12/13/12 2144 12/14/12 0125 12/14/12 0554  BP:  119/73 108/70 113/74  Pulse:  51 79 63  Temp:  97.5 F (36.4 C) 97.4 F (36.3 C) 98.4 F (36.9 C)  TempSrc:  Oral Oral Oral  Resp:  18 18 18   Height: 5\' 8"  (1.727 m)     Weight: 200 lb (90.719 kg)   193 lb 8 oz (87.771 kg)  SpO2:  97% 97% 96%    General: alert, cooperative and mild distress GI: soft, non-tender; bowel sounds normal; no masses,  no organomegaly and no CVAT   Assessment/Plan: [redacted]w[redacted]d Possible Right pyelonephritis, continue antibiotic   LOS: 1 day    ARNOLD,JAMES 12/14/2012, 9:40 AM

## 2012-12-15 DIAGNOSIS — B958 Unspecified staphylococcus as the cause of diseases classified elsewhere: Secondary | ICD-10-CM

## 2012-12-15 DIAGNOSIS — R32 Unspecified urinary incontinence: Secondary | ICD-10-CM

## 2012-12-15 DIAGNOSIS — O2390 Unspecified genitourinary tract infection in pregnancy, unspecified trimester: Principal | ICD-10-CM

## 2012-12-15 DIAGNOSIS — N12 Tubulo-interstitial nephritis, not specified as acute or chronic: Secondary | ICD-10-CM

## 2012-12-15 MED ORDER — CEPHALEXIN 500 MG PO CAPS: 500.0000 mg | ORAL_CAPSULE | Freq: Four times a day (QID) | ORAL | Status: DC

## 2012-12-15 MED ORDER — SULFAMETHOXAZOLE-TRIMETHOPRIM 800-160 MG PO TABS: 1.0000 | ORAL_TABLET | Freq: Two times a day (BID) | ORAL | Status: DC

## 2012-12-15 NOTE — Discharge Summary (Signed)
Physician Discharge Summary  Patient ID: Meghan Salazar MRN: [id number] DOB/AGE: [birth year] 27 y.o.  Admit date: 12/13/2012 Discharge date: 12/15/2012  Admission Diagnoses:pyeolnephritis in pregnancy, 17 weeks  Discharge Diagnoses: UTI, staphylococcus Principal Problem:   Pyelonephritis complicating pregnancy in second trimester, antepartum   Discharged Condition: good  Hospital Course: improved  Consults: None  Significant Diagnostic Studies: microbiology: urine culture: positive for staph coag neg  Treatments: IV hydration and antibiotics: ceftriaxone  Discharge Exam: Blood pressure 121/85, pulse 64, temperature 97.8 F (36.6 C), temperature source Oral, resp. rate 18, height 5\' 8"  (1.727 m), weight 194 lb 4 oz (88.111 kg), last menstrual period 08/14/2012, SpO2 100.00%. General appearance: alert, cooperative and no distress GI: soft, non-tender; bowel sounds normal; no masses,  no organomegaly and no CVAT  Disposition: 01-Home or Self Care     Medication List         albuterol 108 (90 BASE) MCG/ACT inhaler  Commonly known as:  PROVENTIL HFA;VENTOLIN HFA  Inhale 2 puffs into the lungs every 6 (six) hours as needed for wheezing (rescue inhaler).     oxyCODONE-acetaminophen 5-325 MG per tablet  Commonly known as:  PERCOCET/ROXICET  Take 1-2 tablets by mouth every 4 (four) hours as needed for pain.     prenatal multivitamin Tabs  Take 1 tablet by mouth daily at 12 noon.     sulfamethoxazole-trimethoprim 800-160 MG per tablet  Commonly known as:  BACTRIM DS,SEPTRA DS  Take 1 tablet by mouth 2 (two) times daily.       f/u with Dr. Gaynell Face  Signed: Nereyda Bowler 12/15/2012, 7:14 AM

## 2012-12-15 NOTE — Progress Notes (Signed)
Discharge instructions reviewed with patient.  Patient states understanding of home care, medications, activity, signs/symptoms to report to MD and return MD office visit.  No home equipment needed.  Patient ambulated for discharge in stable condition with staff without incident.  

## 2012-12-17 NOTE — MAU Provider Note (Signed)
Attestation of Attending Supervision of Advanced Practitioner (CNM/NP): Evaluation and management procedures were performed by the Advanced Practitioner under my supervision and collaboration.  I have reviewed the Advanced Practitioner's note and chart, and I agree with the management and plan.  Morgaine Kimball 12/17/2012 1:22 PM

## 2012-12-20 ENCOUNTER — Encounter (HOSPITAL_COMMUNITY): Payer: Self-pay | Admitting: Emergency Medicine

## 2012-12-20 ENCOUNTER — Emergency Department (HOSPITAL_COMMUNITY)
Admission: EM | Admit: 2012-12-20 | Discharge: 2012-12-21 | Disposition: A | Discharge status: Home / Self Care | Payer: No Typology Code available for payment source | Attending: Emergency Medicine | Admitting: Emergency Medicine

## 2012-12-20 DIAGNOSIS — Z87891 Personal history of nicotine dependence: Secondary | ICD-10-CM | POA: Insufficient documentation

## 2012-12-20 DIAGNOSIS — Z8742 Personal history of other diseases of the female genital tract: Secondary | ICD-10-CM | POA: Diagnosis not present

## 2012-12-20 DIAGNOSIS — Z79899 Other long term (current) drug therapy: Secondary | ICD-10-CM | POA: Insufficient documentation

## 2012-12-20 DIAGNOSIS — Y9389 Activity, other specified: Secondary | ICD-10-CM | POA: Diagnosis not present

## 2012-12-20 DIAGNOSIS — O9989 Other specified diseases and conditions complicating pregnancy, childbirth and the puerperium: Secondary | ICD-10-CM | POA: Insufficient documentation

## 2012-12-20 DIAGNOSIS — IMO0002 Reserved for concepts with insufficient information to code with codable children: Secondary | ICD-10-CM | POA: Diagnosis not present

## 2012-12-20 DIAGNOSIS — Z87442 Personal history of urinary calculi: Secondary | ICD-10-CM | POA: Insufficient documentation

## 2012-12-20 DIAGNOSIS — J45909 Unspecified asthma, uncomplicated: Secondary | ICD-10-CM | POA: Insufficient documentation

## 2012-12-20 DIAGNOSIS — Y92410 Unspecified street and highway as the place of occurrence of the external cause: Secondary | ICD-10-CM | POA: Insufficient documentation

## 2012-12-20 DIAGNOSIS — M549 Dorsalgia, unspecified: Secondary | ICD-10-CM
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
## 2012-12-22 NOTE — ED Provider Notes (Signed)
Medical screening examination/treatment/procedure(s) were performed by non-physician practitioner and as supervising physician I was immediately available for consultation/collaboration.  Breyon Blass, MD 12/22/12 0001 

## 2013-03-03 ENCOUNTER — Inpatient Hospital Stay (HOSPITAL_COMMUNITY)
Admission: AD | Admit: 2013-03-03 | Discharge: 2013-03-03 | Disposition: A | Discharge status: Home / Self Care | Payer: Medicaid Other | Source: Ambulatory Visit | Attending: Obstetrics and Gynecology | Admitting: Obstetrics and Gynecology

## 2013-03-03 ENCOUNTER — Encounter (HOSPITAL_COMMUNITY): Payer: Self-pay

## 2013-03-03 ENCOUNTER — Inpatient Hospital Stay (HOSPITAL_COMMUNITY): Payer: Medicaid Other

## 2013-03-03 DIAGNOSIS — O0932 Supervision of pregnancy with insufficient antenatal care, second trimester: Secondary | ICD-10-CM

## 2013-03-03 DIAGNOSIS — O4700 False labor before 37 completed weeks of gestation, unspecified trimester: Secondary | ICD-10-CM | POA: Insufficient documentation

## 2013-03-03 DIAGNOSIS — O26879 Cervical shortening, unspecified trimester: Secondary | ICD-10-CM

## 2013-03-03 DIAGNOSIS — O26872 Cervical shortening, second trimester: Secondary | ICD-10-CM

## 2013-03-03 DIAGNOSIS — N949 Unspecified condition associated with female genital organs and menstrual cycle: Secondary | ICD-10-CM | POA: Insufficient documentation

## 2013-03-03 DIAGNOSIS — N760 Acute vaginitis: Secondary | ICD-10-CM

## 2013-03-03 DIAGNOSIS — A499 Bacterial infection, unspecified: Secondary | ICD-10-CM

## 2013-03-03 DIAGNOSIS — O0930 Supervision of pregnancy with insufficient antenatal care, unspecified trimester: Secondary | ICD-10-CM | POA: Insufficient documentation

## 2013-03-03 DIAGNOSIS — B9689 Other specified bacterial agents as the cause of diseases classified elsewhere: Secondary | ICD-10-CM

## 2013-03-03 LAB — CBC
HCT: 31.4 % — ABNORMAL LOW (ref 36.0–46.0)
MCHC: 35 g/dL (ref 30.0–36.0)
MCV: 89.5 fL (ref 78.0–100.0)
Platelets: 152 10*3/uL (ref 150–400)
RBC: 3.51 MIL/uL — ABNORMAL LOW (ref 3.87–5.11)
RDW: 13.4 % (ref 11.5–15.5)
WBC: 9.9 10*3/uL (ref 4.0–10.5)

## 2013-03-03 LAB — RAPID URINE DRUG SCREEN, HOSP PERFORMED
Opiates: NOT DETECTED
Tetrahydrocannabinol: POSITIVE — AB

## 2013-03-03 LAB — OB RESULTS CONSOLE GC/CHLAMYDIA: Gonorrhea: NEGATIVE

## 2013-03-03 LAB — DIFFERENTIAL
Basophils Absolute: 0 10*3/uL (ref 0.0–0.1)
Basophils Relative: 0 % (ref 0–1)
Lymphocytes Relative: 24 % (ref 12–46)
Lymphs Abs: 2.4 10*3/uL (ref 0.7–4.0)
Neutro Abs: 6.9 10*3/uL (ref 1.7–7.7)
Neutrophils Relative %: 69 % (ref 43–77)

## 2013-03-03 LAB — URINALYSIS, ROUTINE W REFLEX MICROSCOPIC
Ketones, ur: NEGATIVE mg/dL
Nitrite: NEGATIVE
Protein, ur: NEGATIVE mg/dL
Urobilinogen, UA: 0.2 mg/dL (ref 0.0–1.0)

## 2013-03-03 LAB — OB RESULTS CONSOLE HIV ANTIBODY (ROUTINE TESTING): HIV: NONREACTIVE

## 2013-03-03 LAB — POCT FERN TEST: POCT Fern Test: NEGATIVE

## 2013-03-03 LAB — WET PREP, GENITAL: Trich, Wet Prep: NONE SEEN

## 2013-03-03 LAB — RAPID HIV SCREEN (WH-MAU): Rapid HIV Screen: NONREACTIVE

## 2013-03-03 LAB — ABO/RH: ABO/RH(D): B POS

## 2013-03-03 MED ORDER — METRONIDAZOLE 500 MG PO TABS: 500.0000 mg | ORAL_TABLET | Freq: Two times a day (BID) | ORAL | Status: DC

## 2013-03-03 MED ORDER — BETAMETHASONE SOD PHOS & ACET 6 (3-3) MG/ML IJ SUSP
12.0000 mg | Freq: Once | INTRAMUSCULAR | Status: AC
  Administered 2013-03-03: 12 mg via INTRAMUSCULAR
  Filled 2013-03-03: qty 2

## 2013-03-03 MED ORDER — PROGESTERONE MICRONIZED 200 MG PO CAPS: 200.0000 mg | ORAL_CAPSULE | Freq: Every day | ORAL | Status: DC

## 2013-03-03 MED ORDER — BETAMETHASONE SOD PHOS & ACET 6 (3-3) MG/ML IJ SUSP: 12.0000 mg | Freq: Once | INTRAMUSCULAR | Status: DC

## 2013-03-03 NOTE — MAU Provider Note (Signed)
History     CSN: [id number]  Arrival date and time: 03/03/13 1748   None     Chief Complaint  Patient presents with  . Vaginal Discharge  . Contractions   Vaginal Discharge The patient's primary symptoms include a vaginal discharge. Associated symptoms include abdominal pain. Pertinent negatives include no chills, diarrhea, dysuria, fever, flank pain, headaches, nausea or vomiting.   Meghan Salazar is 27 y.o. G3P2002 at [redacted]w[redacted]d by 17w ultrasound with pregnancy complicated by inadequate prenatal care and prior pyelonephritis who presents with complaint of abdominal pain and leakage of clear vaginal fluid x 1 day. Pt reports onset of bilateral lower abdominal cramping this morning that resolved after rest. Later in the day she then developed sharp bilateral upper abdominal pain that she describes as intermittent, severe, sharp and 2-3 seconds in duration each time. She states this pain feels similar to contractions. She also endorses leakage of clear vaginal fluid throughout the day. She has noticed her underwear were damp but denies any gushing of fluid and has not had to wear a pad. She reports normal fetal movement. She denies vaginal bleeding. She reports an episode of falling down the stairs 2 weeks ago but did not seek medical evaluation at that time. She denies chest pain, SOB, dysuria, nausea, vomiting and flank pain. Of note pt states she does not have an established prenatal care provider and has only been to the health department one time when she was given prenatal vitamins.   OB History   Grav Para Term Preterm Abortions TAB SAB Ect Mult Living   3 2 2       2       Past Medical History  Diagnosis Date  . Asthma   . Other and unspecified ovarian cysts   . Infection     uti  . Abnormal Pap smear     biopsy    Past Surgical History  Procedure Laterality Date  . No past surgeries      Family History  Problem Relation Age of Onset  . Diabetes Maternal Grandmother   .  Hypertension Paternal Grandmother   . Diabetes Paternal Grandmother     History  Substance Use Topics  . Smoking status: Former Smoker -- 0.25 packs/day    Types: Cigarettes    Quit date: 07/27/2011  . Smokeless tobacco: Never Used  . Alcohol Use: No    Allergies: No Known Allergies  Prescriptions prior to admission  Medication Sig Dispense Refill  . Prenatal Vit-Fe Fumarate-FA (PRENATAL MULTIVITAMIN) TABS Take 1 tablet by mouth at bedtime.       Marland Kitchen albuterol (PROVENTIL HFA;VENTOLIN HFA) 108 (90 BASE) MCG/ACT inhaler Inhale 2 puffs into the lungs every 6 (six) hours as needed for wheezing (rescue inhaler).        Review of Systems  Constitutional: Negative for fever and chills.  Eyes: Negative for blurred vision.  Respiratory: Negative for shortness of breath.   Cardiovascular: Negative for chest pain.  Gastrointestinal: Positive for abdominal pain. Negative for nausea, vomiting and diarrhea.  Genitourinary: Positive for vaginal discharge. Negative for dysuria and flank pain.  Musculoskeletal: Positive for falls (2 weeks ago).  Neurological: Negative for headaches.   Physical Exam   Blood pressure 127/64, pulse 72, temperature 97.5 F (36.4 C), temperature source Oral, resp. rate 18, last menstrual period 08/14/2012, not currently breastfeeding.  Physical Exam  Constitutional: She is oriented to person, place, and time. She appears well-developed and well-nourished.  Cardiovascular: Normal rate, regular  rhythm and normal heart sounds.   Respiratory: Effort normal and breath sounds normal. No respiratory distress. She has no wheezes.  GI: Soft. Bowel sounds are normal. She exhibits no distension. There is no tenderness.  Genitourinary: Vaginal discharge (copious white thin) found.  Neurological: She is alert and oriented to person, place, and time.  Skin: Skin is warm and dry.  Dilation: 1 Effacement (%): 30 Station: Ballotable Exam by:: RCoward  No CVAT  Negative  pooling, negative ferning.   Results for Meghan, Salazar (MRN [id number]) as of 03/03/2013 19:36  Ref. Range 03/03/2013 18:10 03/03/2013 18:14 03/03/2013 18:30  Yeast Wet Prep HPF POC Latest Range: NONE SEEN  NONE SEEN    Trich, Wet Prep Latest Range: NONE SEEN  NONE SEEN    Clue Cells Wet Prep HPF POC Latest Range: NONE SEEN  MANY (A)    WBC, Wet Prep HPF POC Latest Range: NONE SEEN  MANY (A)    Color, Urine Latest Range: YELLOW    YELLOW  APPearance Latest Range: CLEAR    CLEAR  Specific Gravity, Urine Latest Range: 1.005-1.030    1.020  pH Latest Range: 5.0-8.0    6.0  Glucose Latest Range: NEGATIVE mg/dL   NEGATIVE  Bilirubin Urine Latest Range: NEGATIVE    NEGATIVE  Ketones, ur Latest Range: NEGATIVE mg/dL   NEGATIVE  Protein Latest Range: NEGATIVE mg/dL   NEGATIVE  Urobilinogen, UA Latest Range: 0.0-1.0 mg/dL   0.2  Nitrite Latest Range: NEGATIVE    NEGATIVE  Leukocytes, UA Latest Range: NEGATIVE    MODERATE (A)  Hgb urine dipstick Latest Range: NEGATIVE    NEGATIVE  WBC, UA Latest Range: <3 WBC/hpf   11-20  RBC / HPF Latest Range: <3 RBC/hpf   0-2  Squamous Epithelial / LPF Latest Range: RARE    FEW (A)  Bacteria, UA Latest Range: RARE    MANY (A)   FHT: 150s mod var, mult accel, no decel Toco:  No ctx  MAU Course  Procedures  MDM Pt with no prenatal care up to this point. BMZ started on 10/19, shortened cervix found on [place], will treat with Prometrium, BV tx with flagyl. UA +LE, f/u in clinic this week.  Assessment and Plan  Meghan Salazar is a 27 y.o. G3P2002 at [redacted]w[redacted]d presents with concern for possible ROM and preterm labor.  #PTL/ROM - Neg ferning, neg pooling. No ctx on toco. Unlikely in labor. Will give BMZ 12mg  now and follow up tomorrow.  #Shortened Cervix - reports history in second pregnancy. Was not treated, delivered term. Will start prometrium 200mg  QHS now. F/u in [facility]. Recommend q2wk [place] monitoring  #BV: flagyl 500mg  BID x7d  #LE on UA: Few squams. F/u in  clinic for r/o infection. Pt does have hx of pyelonephritis. Consider suppression at follow up this week with cultures.  Discharged home with strong return preterm labor precautions. Prenatal labs drawn.  Tawana Scale 03/03/2013, 7:25 PM

## 2013-03-03 NOTE — MAU Note (Signed)
Pt states here for contractions that began this am. No pooling noted with speculum exam. Notes thick vaginal discharge for a few days.

## 2013-03-04 LAB — GC/CHLAMYDIA PROBE AMP: GC Probe RNA: NEGATIVE

## 2013-03-04 LAB — URINE CULTURE: Culture: NO GROWTH

## 2013-03-04 LAB — RPR: RPR Ser Ql: NONREACTIVE

## 2013-03-04 LAB — HEPATITIS B SURFACE ANTIGEN: Hepatitis B Surface Ag: NEGATIVE

## 2013-03-05 ENCOUNTER — Inpatient Hospital Stay (HOSPITAL_COMMUNITY)
Admission: AD | Admit: 2013-03-05 | Discharge: 2013-03-05 | Disposition: A | Discharge status: Home / Self Care | Payer: Medicaid Other | Source: Ambulatory Visit | Attending: Obstetrics & Gynecology | Admitting: Obstetrics & Gynecology

## 2013-03-05 ENCOUNTER — Telehealth: Payer: Self-pay | Admitting: Obstetrics & Gynecology

## 2013-03-05 DIAGNOSIS — O4700 False labor before 37 completed weeks of gestation, unspecified trimester: Secondary | ICD-10-CM | POA: Insufficient documentation

## 2013-03-05 LAB — RUBELLA SCREEN: Rubella: 7.51 Index — ABNORMAL HIGH (ref <0.90)

## 2013-03-05 MED ORDER — BETAMETHASONE SOD PHOS & ACET 6 (3-3) MG/ML IJ SUSP
12.0000 mg | Freq: Once | INTRAMUSCULAR | Status: AC
  Administered 2013-03-05: 12 mg via INTRAMUSCULAR
  Filled 2013-03-05: qty 2

## 2013-03-05 MED ORDER — PRENATAL VITAMINS 28-0.8 MG PO TABS: 1.0000 | ORAL_TABLET | Freq: Every day | ORAL | Status: DC

## 2013-03-05 NOTE — Telephone Encounter (Signed)
Called patient about appointment. Had to leave a message.

## 2013-03-07 ENCOUNTER — Inpatient Hospital Stay (HOSPITAL_COMMUNITY)
Admission: AD | Admit: 2013-03-07 | Discharge: 2013-03-09 | DRG: 774 | Disposition: A | Discharge status: Home / Self Care | Payer: Medicaid Other | Source: Ambulatory Visit | Attending: Obstetrics & Gynecology | Admitting: Obstetrics & Gynecology

## 2013-03-07 ENCOUNTER — Encounter (HOSPITAL_COMMUNITY): Payer: Self-pay | Admitting: *Deleted

## 2013-03-07 ENCOUNTER — Inpatient Hospital Stay (HOSPITAL_COMMUNITY): Payer: Medicaid Other

## 2013-03-07 DIAGNOSIS — Z22330 Carrier of Group B streptococcus: Secondary | ICD-10-CM

## 2013-03-07 DIAGNOSIS — O4690 Antepartum hemorrhage, unspecified, unspecified trimester: Principal | ICD-10-CM | POA: Diagnosis present

## 2013-03-07 DIAGNOSIS — O26879 Cervical shortening, unspecified trimester: Secondary | ICD-10-CM

## 2013-03-07 DIAGNOSIS — O0932 Supervision of pregnancy with insufficient antenatal care, second trimester: Secondary | ICD-10-CM

## 2013-03-07 DIAGNOSIS — O0930 Supervision of pregnancy with insufficient antenatal care, unspecified trimester: Secondary | ICD-10-CM

## 2013-03-07 DIAGNOSIS — O2302 Infections of kidney in pregnancy, second trimester: Secondary | ICD-10-CM

## 2013-03-07 DIAGNOSIS — O9989 Other specified diseases and conditions complicating pregnancy, childbirth and the puerperium: Secondary | ICD-10-CM

## 2013-03-07 DIAGNOSIS — O26872 Cervical shortening, second trimester: Secondary | ICD-10-CM

## 2013-03-07 DIAGNOSIS — B9689 Other specified bacterial agents as the cause of diseases classified elsewhere: Secondary | ICD-10-CM

## 2013-03-07 DIAGNOSIS — O99892 Other specified diseases and conditions complicating childbirth: Secondary | ICD-10-CM | POA: Diagnosis present

## 2013-03-07 LAB — GROUP B STREP BY PCR: Group B strep by PCR: POSITIVE — AB

## 2013-03-07 LAB — TYPE AND SCREEN
ABO/RH(D): B POS
Antibody Screen: NEGATIVE

## 2013-03-07 LAB — CBC
HCT: 31.7 % — ABNORMAL LOW (ref 36.0–46.0)
Hemoglobin: 11.4 g/dL — ABNORMAL LOW (ref 12.0–15.0)
MCH: 31.5 pg (ref 26.0–34.0)
MCV: 87.6 fL (ref 78.0–100.0)
Platelets: 158 10*3/uL (ref 150–400)
RBC: 3.62 MIL/uL — ABNORMAL LOW (ref 3.87–5.11)
WBC: 11.6 10*3/uL — ABNORMAL HIGH (ref 4.0–10.5)

## 2013-03-07 LAB — OB RESULTS CONSOLE GBS: GBS: POSITIVE

## 2013-03-07 MED ORDER — LIDOCAINE HCL (PF) 1 % IJ SOLN
30.0000 mL | INTRAMUSCULAR | Status: DC | PRN
  Filled 2013-03-07 (×2): qty 30

## 2013-03-07 MED ORDER — OXYCODONE-ACETAMINOPHEN 5-325 MG PO TABS
1.0000 | ORAL_TABLET | ORAL | Status: DC | PRN
  Administered 2013-03-08: 1 via ORAL
  Filled 2013-03-07: qty 1

## 2013-03-07 MED ORDER — MISOPROSTOL 200 MCG PO TABS
ORAL_TABLET | ORAL | Status: AC
  Administered 2013-03-07: 800 ug via RECTAL
  Filled 2013-03-07: qty 4

## 2013-03-07 MED ORDER — ONDANSETRON HCL 4 MG/2ML IJ SOLN: 4.0000 mg | Freq: Four times a day (QID) | INTRAMUSCULAR | Status: DC | PRN

## 2013-03-07 MED ORDER — ACETAMINOPHEN 325 MG PO TABS
650.0000 mg | ORAL_TABLET | ORAL | Status: DC | PRN
  Administered 2013-03-07: 650 mg via ORAL
  Filled 2013-03-07: qty 2

## 2013-03-07 MED ORDER — IBUPROFEN 600 MG PO TABS
600.0000 mg | ORAL_TABLET | Freq: Four times a day (QID) | ORAL | Status: DC | PRN
  Administered 2013-03-07: 600 mg via ORAL
  Filled 2013-03-07: qty 1

## 2013-03-07 MED ORDER — AMPICILLIN SODIUM 2 G IJ SOLR
2.0000 g | Freq: Four times a day (QID) | INTRAMUSCULAR | Status: DC
  Administered 2013-03-07: 2 g via INTRAVENOUS
  Filled 2013-03-07 (×4): qty 2000

## 2013-03-07 MED ORDER — MAGNESIUM SULFATE 40 G IN LACTATED RINGERS - SIMPLE
2.0000 g/h | INTRAVENOUS | Status: DC
  Administered 2013-03-07: 2 g/h via INTRAVENOUS
  Filled 2013-03-07: qty 500

## 2013-03-07 MED ORDER — MAGNESIUM SULFATE BOLUS VIA INFUSION
6.0000 g | Freq: Once | INTRAVENOUS | Status: DC
  Filled 2013-03-07: qty 500

## 2013-03-07 MED ORDER — METRONIDAZOLE 500 MG PO TABS
500.0000 mg | ORAL_TABLET | Freq: Two times a day (BID) | ORAL | Status: DC
  Administered 2013-03-08: 500 mg via ORAL
  Filled 2013-03-07 (×2): qty 1

## 2013-03-07 MED ORDER — OXYTOCIN 40 UNITS IN LACTATED RINGERS INFUSION - SIMPLE MED
62.5000 mL/h | INTRAVENOUS | Status: DC
  Filled 2013-03-07: qty 1000

## 2013-03-07 MED ORDER — OXYTOCIN BOLUS FROM INFUSION
500.0000 mL | INTRAVENOUS | Status: DC
  Administered 2013-03-07: 500 mL via INTRAVENOUS

## 2013-03-07 MED ORDER — CITRIC ACID-SODIUM CITRATE 334-500 MG/5ML PO SOLN: 30.0000 mL | ORAL | Status: DC | PRN

## 2013-03-07 MED ORDER — PENICILLIN G POTASSIUM 5000000 UNITS IJ SOLR
2.5000 10*6.[IU] | INTRAVENOUS | Status: DC
  Filled 2013-03-07 (×3): qty 2.5

## 2013-03-07 MED ORDER — PENICILLIN G POTASSIUM 5000000 UNITS IJ SOLR
5.0000 10*6.[IU] | Freq: Once | INTRAVENOUS | Status: DC
  Filled 2013-03-07: qty 5

## 2013-03-07 MED ORDER — LACTATED RINGERS IV SOLN
INTRAVENOUS | Status: DC
  Administered 2013-03-07: 17:00:00 via INTRAVENOUS

## 2013-03-07 MED ORDER — LACTATED RINGERS IV SOLN: 500.0000 mL | INTRAVENOUS | Status: DC | PRN

## 2013-03-07 NOTE — MAU Note (Signed)
Pt reports she woke up from her nap and went to BR and noticed some bleeding and clots. Reports having some cramping and pressure. Has started progesteron supp. For a shortened cervix and has been on bedrest.

## 2013-03-07 NOTE — Consult Note (Signed)
Neonatology Consult:  Asked by Dr Penne Lash to speak to Meghan Salazar to discuss outcome of premature infants at current gestation. Chart reviewed. Meghan Salazar is 29 2/[redacted] wks pregnant, little or no prenatal care. Received betamethasone on 10/19 and 10/21. Prenatal labs:  GBS pos, HIV pending.  Medications: Ampicillin, Flagyl, Ibuprofen, Magnesium Sulfate.  [place] today showed normal fluid, vertex.  I spoke to Meghan Salazar with her mother present. She appeared to be having contractions through our conversation.  I discussed general survival rate for 29 wk preterm. I discussed resuscitation and the most common morbidities associated with this age, namely RDS with various resp support, immaturity of the GI system req IV,  HAL/NG feeding initially, temp support, Increased susceptibility to infection, possible tx with antibiotics (GBS status was pending at time of consult), and LOS. She was quite emotional during our conversation.  I discussed breastfeeding and its benefits and encouraged her to provide milk.  Thank you for inviting [place] to meet Meghan Salazar and counsel her in preparation for preterm delivery.   This consult took 30 min, >50% of time was spent face to face counseling this patient.  Lucillie Garfinkel, MD Neonatologist

## 2013-03-07 NOTE — Progress Notes (Signed)
Reola Calkins, MD, notified of preliminary ultrasound results, UC activity becoming more uncomfortable, and scant amount of bleeding noted on towel. MD also notified that NICU consult completed by Dr. Wardell Heath and that the chaplain is at the bedside with the patient.

## 2013-03-07 NOTE — H&P (Signed)
Meghan Salazar is a 27 y.o. female G3P2002 with IUP at [redacted]w[redacted]d by LMP c/w 4 week [place] presenting for vaginal bleeding and abdominal cramping.   Pt states that this AM she woke up and feeling some mild menstrual cramps. Over the course of the AM, they continued so she decided to lay down and take a nap. When she woke up, she went to the bathroom and passed several bright red blood clots. She continued to have blood on tissue paper the next hour or so.  Thus, she decided to come in. Baby has been moving well.    Pt has had inadequate care throughout this pregnancy and has not been seen since July in clinic.  She was seen in the MAU on 10/19 and at that time was diagnosed with a shortened cervix of 1.1cm.  Pt states she has a hx of shortened cervix with her previous delivery being at term. She was started on PV progesterone.   Given BMZ on 10/19 and 10/21. She has been taking it easy at home since.       Prenatal History/Complications:  Past Medical History: Past Medical History  Diagnosis Date  . Asthma   . Other and unspecified ovarian cysts   . Infection     uti  . Abnormal Pap smear     biopsy    Past Surgical History: Past Surgical History  Procedure Laterality Date  . No past surgeries      Obstetrical History: OB History   Grav Para Term Preterm Abortions TAB SAB Ect Mult Living   3 2 2       2     G1- NSVD, term, 6lb4oz G2- NSVD term, 6lb 5oz- hx of shortened cervix not on meds.  G3- current  Social History: History   Social History  . Marital Status: Single    Spouse Name: N/A    Number of Children: N/A  . Years of Education: N/A   Social History Main Topics  . Smoking status: Former Smoker -- 0.25 packs/day    Types: Cigarettes    Quit date: 07/27/2011  . Smokeless tobacco: Never Used  . Alcohol Use: No  . Drug Use: No  . Sexual Activity: Yes    Birth Control/ Protection: None     Comment: wants tubal   Other Topics Concern  . None   Social History  Narrative  . None    Family History: Family History  Problem Relation Age of Onset  . Diabetes Maternal Grandmother   . Hypertension Paternal Grandmother   . Diabetes Paternal Grandmother     Allergies: No Known Allergies  Prescriptions prior to admission  Medication Sig Dispense Refill  . metroNIDAZOLE (FLAGYL) 500 MG tablet Take 1 tablet (500 mg total) by mouth 2 (two) times daily.  14 tablet  0  . Prenatal Vit-Fe Fumarate-FA (PRENATAL MULTIVITAMIN) TABS Take 1 tablet by mouth at bedtime.       . progesterone (PROMETRIUM) 200 MG capsule Place 1 capsule (200 mg total) vaginally at bedtime.  60 capsule  0  . albuterol (PROVENTIL HFA;VENTOLIN HFA) 108 (90 BASE) MCG/ACT inhaler Inhale 2 puffs into the lungs every 6 (six) hours as needed for wheezing (rescue inhaler).         Review of Systems   A complete ROS was performed and negative except as stated above.     Blood pressure 124/65, pulse 79, resp. rate 18, last menstrual period 08/14/2012, not currently breastfeeding. General appearance: alert, cooperative  and tearful and anxious Lungs: clear to auscultation bilaterally Heart: regular rate and rhythm Abdomen: soft, non-tender; bowel sounds normal SSE: cervix visually open with hourglassing membranes and visible hair past the os. approx 4cm open. Minimal dried, brown blood in the vault.  Extremities: Homans sign is negative, no sign of DVT Presentation: cephalic Fetal monitoringBaseline: 140 bpm, Variability: Good {> 6 bpm), Accelerations: Reactive and Decelerations: Absent Uterine activity occasional approx every 10-2min.     Prenatal labs: ABO, Rh: --/--/B POS (10/19 2219) Antibody: NEG (11/30 1815) Rubella:   RPR: NON REACTIVE (10/19 2218)  HBsAg: NEGATIVE (10/19 2218)  HIV:    GBS: unknown. Last pregnancy +. Currently unknown.   1 hr Glucola not done  Genetic screening  Not done Anatomy US normal    Assessment: Meghan Salazar is a 27 y.o. [id number] with  an IUP at [redacted]w[redacted]d presenting for cervical insufficiency with hourglassing membranes, bloody show and threatened PTL.   Plan: 1) admit to L&D  2) threatened PTL - start CP mag 6/2  - s/p BMZ x 2 - NICU consult  - monitor on TOCO - cont flagyl for BV  3) vaginal bleeding - more consistent with bloody show than abruption - obtain limited US   4) FWB - Cat I for gestational age.  - s/p BMZ - GBS PCR sent  - started on ampicillin while it is pending.    Vale Haven, MD 03/07/2013, 4:47 PM

## 2013-03-07 NOTE — Progress Notes (Signed)
   Meghan Salazar is a 27 y.o. G3P2002 at [redacted]w[redacted]d  admitted for advanced cervical dilation with hourglassing membranes  Subjective:  Feels a painful contraction every 15 minutes or so Objective: BP 119/53  Pulse 69  Temp(Src) 98.7 F (37.1 C) (Axillary)  Resp 18  Ht 5\' 8"  (1.727 m)  Wt 87.998 kg (194 lb)  BMI 29.5 kg/m2  SpO2 100%  LMP 08/14/2012 Total I/O In: 155 [P.O.:30; I.V.:125] Out: 0   FHT:  FHR: 140 bpm, variability: moderate,  accelerations:  Present,  decelerations:  Absent UC:   irregular, every 10-15 minutes SVE:    deferred.  Previous exam:  Visually 3-4 cms with hourglassing membranes  Labs: Lab Results  Component Value Date   WBC 11.6* 03/07/2013   HGB 11.4* 03/07/2013   HCT 31.7* 03/07/2013   MCV 87.6 03/07/2013   PLT 158 03/07/2013    Assessment / Plan: preterm labor, stable for now Continue MgSO4 GBS +, so continue ampicillin Fetal Wellbeing:  Category I Pain Control:  Labor support without medications Anticipated MOD:  NSVD  CRESENZO-DISHMAN,Baljit Liebert 03/07/2013, 9:35 PM

## 2013-03-08 ENCOUNTER — Encounter (HOSPITAL_COMMUNITY): Payer: Self-pay

## 2013-03-08 ENCOUNTER — Encounter: Payer: Medicaid Other | Admitting: Family Medicine

## 2013-03-08 ENCOUNTER — Encounter: Payer: Self-pay | Admitting: *Deleted

## 2013-03-08 DIAGNOSIS — O0932 Supervision of pregnancy with insufficient antenatal care, second trimester: Secondary | ICD-10-CM

## 2013-03-08 LAB — RAPID URINE DRUG SCREEN, HOSP PERFORMED
Amphetamines: NOT DETECTED
Barbiturates: NOT DETECTED
Benzodiazepines: NOT DETECTED
Opiates: NOT DETECTED
Tetrahydrocannabinol: POSITIVE — AB

## 2013-03-08 MED ORDER — OXYCODONE-ACETAMINOPHEN 5-325 MG PO TABS
1.0000 | ORAL_TABLET | ORAL | Status: DC | PRN
  Administered 2013-03-08: 1 via ORAL
  Administered 2013-03-08: 2 via ORAL
  Administered 2013-03-08 (×2): 1 via ORAL
  Filled 2013-03-08: qty 2
  Filled 2013-03-08: qty 1
  Filled 2013-03-08: qty 2
  Filled 2013-03-08: qty 1

## 2013-03-08 MED ORDER — FLEET ENEMA 7-19 GM/118ML RE ENEM: 1.0000 | ENEMA | Freq: Every day | RECTAL | Status: DC | PRN

## 2013-03-08 MED ORDER — SENNOSIDES-DOCUSATE SODIUM 8.6-50 MG PO TABS
2.0000 | ORAL_TABLET | ORAL | Status: DC
  Administered 2013-03-08: 2 via ORAL
  Filled 2013-03-08: qty 2

## 2013-03-08 MED ORDER — IBUPROFEN 600 MG PO TABS
600.0000 mg | ORAL_TABLET | Freq: Four times a day (QID) | ORAL | Status: DC
  Administered 2013-03-08 – 2013-03-09 (×6): 600 mg via ORAL
  Filled 2013-03-08 (×6): qty 1

## 2013-03-08 MED ORDER — PRENATAL MULTIVITAMIN CH: 1.0000 | ORAL_TABLET | Freq: Every day | ORAL | Status: DC

## 2013-03-08 MED ORDER — DIPHENHYDRAMINE HCL 25 MG PO CAPS: 25.0000 mg | ORAL_CAPSULE | Freq: Four times a day (QID) | ORAL | Status: DC | PRN

## 2013-03-08 MED ORDER — ONDANSETRON HCL 4 MG PO TABS: 4.0000 mg | ORAL_TABLET | ORAL | Status: DC | PRN

## 2013-03-08 MED ORDER — ZOLPIDEM TARTRATE 5 MG PO TABS: 5.0000 mg | ORAL_TABLET | Freq: Every evening | ORAL | Status: DC | PRN

## 2013-03-08 MED ORDER — BISACODYL 10 MG RE SUPP: 10.0000 mg | Freq: Every day | RECTAL | Status: DC | PRN

## 2013-03-08 MED ORDER — WITCH HAZEL-GLYCERIN EX PADS: 1.0000 "application " | MEDICATED_PAD | CUTANEOUS | Status: DC | PRN

## 2013-03-08 MED ORDER — SIMETHICONE 80 MG PO CHEW: 80.0000 mg | CHEWABLE_TABLET | ORAL | Status: DC | PRN

## 2013-03-08 MED ORDER — LANOLIN HYDROUS EX OINT: TOPICAL_OINTMENT | CUTANEOUS | Status: DC | PRN

## 2013-03-08 MED ORDER — MEASLES, MUMPS & RUBELLA VAC ~~LOC~~ INJ
0.5000 mL | INJECTION | Freq: Once | SUBCUTANEOUS | Status: DC
  Filled 2013-03-08: qty 0.5

## 2013-03-08 MED ORDER — ONDANSETRON HCL 4 MG/2ML IJ SOLN: 4.0000 mg | INTRAMUSCULAR | Status: DC | PRN

## 2013-03-08 MED ORDER — FERROUS SULFATE 325 (65 FE) MG PO TABS
325.0000 mg | ORAL_TABLET | Freq: Two times a day (BID) | ORAL | Status: DC
  Administered 2013-03-08 (×2): 325 mg via ORAL
  Filled 2013-03-08 (×2): qty 1

## 2013-03-08 MED ORDER — BENZOCAINE-MENTHOL 20-0.5 % EX AERO: 1.0000 "application " | INHALATION_SPRAY | CUTANEOUS | Status: DC | PRN

## 2013-03-08 MED ORDER — METHYLERGONOVINE MALEATE 0.2 MG/ML IJ SOLN: 0.2000 mg | INTRAMUSCULAR | Status: DC | PRN

## 2013-03-08 MED ORDER — METHYLERGONOVINE MALEATE 0.2 MG PO TABS: 0.2000 mg | ORAL_TABLET | ORAL | Status: DC | PRN

## 2013-03-08 MED ORDER — DIBUCAINE 1 % RE OINT: 1.0000 "application " | TOPICAL_OINTMENT | RECTAL | Status: DC | PRN

## 2013-03-08 MED ORDER — TETANUS-DIPHTH-ACELL PERTUSSIS 5-2.5-18.5 LF-MCG/0.5 IM SUSP: 0.5000 mL | Freq: Once | INTRAMUSCULAR | Status: DC

## 2013-03-08 MED ORDER — OXYTOCIN 40 UNITS IN LACTATED RINGERS INFUSION - SIMPLE MED: 62.5000 mL/h | INTRAVENOUS | Status: DC | PRN

## 2013-03-08 NOTE — Lactation Note (Signed)
This note was copied from the chart of Boy Emery Binz. Lactation Consultation Note  Patient Name: Boy Karolee Meloni [id number] Date: 03/08/2013 Reason for consult: Initial assessment;NICU baby DEBP set up by RN, Mom has pumped 1 time. She is on her way to NICU to see baby. Reviewed importance of pumping every 3 hours for 15 minutes on preemie setting. NICU booklet left for Mom, reviewed storage guidelines. Mom reports she has WIC, advised Mom of [facility] loaner program if needed.   Maternal Data Formula Feeding for Exclusion: Yes Reason for exclusion: Admission to Intensive Care Unit (ICU) post-partum (baby in NICU)  Feeding    LATCH Score/Interventions                      Lactation Tools Discussed/Used Tools: Pump Breast pump type: Double-Electric Breast Pump WIC Program: Yes Pump Review: Milk Storage Initiated by:: by RN Date initiated:: 03/08/13   Consult Status Consult Status: Follow-up Date: 03/09/13 Follow-up type: In-patient    Alfred Levins 03/08/2013, 1:50 PM

## 2013-03-08 NOTE — MAU Provider Note (Signed)
Post Partum Day 1 Subjective: no complaints, tolerating PO and attempted breast pump x1 for 15 minutes without success. Discussed possiblility of lactation consult after visit or during stay. Patient would "like the paperwork for a tubal ligation procedure right now" and wants the procedure before she leaves if possible; she reluctantly agreed to [facility] if she has to use alternate contraception for a waiting period.  Objective: Blood pressure 129/71, pulse 73, temperature 98.6 F (37 C), temperature source Oral, resp. rate 18, height 5\' 8"  (1.727 m), weight 194 lb (87.998 kg), last menstrual period 08/14/2012, SpO2 97.00%, unknown if currently breastfeeding.  Physical Exam:  General: alert, appears stated age and no distress Lochia: appropriate Uterine Fundus: firm DVT Evaluation: No evidence of DVT seen on physical exam.   Recent Labs  03/07/13 1650  HGB 11.4*  HCT 31.7*    Assessment/Plan: Lactation consult   LOS: 1 day   Jefm Petty 03/08/2013, 8:07 AM   Evaluation and management procedures were performed by PA-S under my supervision/collaboration. Chart reviewed, patient examined by me and I agree with management and plan. Baby stable in NICU. Interval tubal consent signed. Will get UDS due to NPC/PTB. Probable discharge tomorrow.  Danae Orleans, CNM 03/08/2013 5:30 PM

## 2013-03-08 NOTE — Progress Notes (Signed)
03/08/13 1600  Clinical Encounter Type  Visited With Patient and family together (pt and FOB)  Visit Type Follow-up;Spiritual support;Social support  Referral From Chaplain Rema Jasmine)  Spiritual Encounters  Spiritual Needs Emotional  Stress Factors  Patient Stress Factors Loss of control;Major life changes (unexpected early delivery and NICU stay)   Followed up with Meghan Salazar and her boyfriend to check in following delivery, per referral from Chaplain Rema Jasmine.  Meghan Salazar was tired from pain meds, but much relieved since her first pastoral visit because, per pt, she has been able to see her baby four times today and hears positive reports of his condition.  Family is aware of ongoing chaplain availability, and Spiritual Care will follow, but please page as needed:  [phone].  [address] [place], [place] [phone]

## 2013-03-08 NOTE — Progress Notes (Signed)
UR completed 

## 2013-03-09 MED ORDER — FERROUS SULFATE 325 (65 FE) MG PO TABS: 325.0000 mg | ORAL_TABLET | Freq: Two times a day (BID) | ORAL | Status: DC

## 2013-03-09 MED ORDER — DOCUSATE SODIUM 100 MG PO CAPS: 100.0000 mg | ORAL_CAPSULE | Freq: Two times a day (BID) | ORAL | Status: DC | PRN

## 2013-03-09 MED ORDER — OXYCODONE-ACETAMINOPHEN 5-325 MG PO TABS: 1.0000 | ORAL_TABLET | Freq: Four times a day (QID) | ORAL | Status: DC | PRN

## 2013-03-09 MED ORDER — IBUPROFEN 600 MG PO TABS: 600.0000 mg | ORAL_TABLET | Freq: Four times a day (QID) | ORAL | Status: DC

## 2013-03-09 NOTE — Progress Notes (Signed)
Clinical Social Work Department PSYCHOSOCIAL ASSESSMENT - MATERNAL/CHILD 03/09/2013  Patient:  Chagoya,Gracelee M  Account Number:  [id number]  Admit Date:  03/07/2013  Childs Name:   Nashad Elijah Haskins    Clinical Social Worker:  Emilyanne Mcgough, LCSW   Date/Time:  03/09/2013 02:00 PM  Date Referred:  03/08/2013   Referral source  NICU     Referred reason  NICU   Other referral source:    I:  FAMILY / HOME ENVIRONMENT Child's legal guardian:  PARENT  Guardian - Name Guardian - Age Guardian - Address  Debrosse,Merri M [address]  [place],  [address]  Haskins, Dexter 30    Other household support members/support persons Other support:   paternal relatives    II  PSYCHOSOCIAL DATA Information Source:  Patient Interview  Financial and Community Resources Employment:   Financial resources:  Medicaid If Medicaid - County:   Other  Food Stamps  WIC  [employer]  Public Housing   School / Grade:   Maternity Care Coordinator / Child Services Coordination / Early Interventions:  Cultural issues impacting care:    III  STRENGTHS Strengths  Compliance with medical plan  Supportive family/friends  Understanding of illness  Adequate Resources   Strength comment:    IV  RISK FACTORS AND CURRENT PROBLEMS Current Problem:       V  SOCIAL WORK ASSESSMENT Met with mother who was pleasant and receptive to social work intervention.   She is a single parent with two other dependents ages 3 and 1.  Mother known to social work from previous interventions during pregnancy.  Informed that this was an unplanned pregnancy and she planned to terminate the pregnancy but decided against it.  Mother states "I went twice to have the abortion, but I couldn't follow through".  Informed that a couple of ladies from an agency talked her out of it, and they have continued to support her.  Informed that her financial situation has significantly improved.   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    FOB is employed.  Mother seems to be coping well with newborn NICU admission.   She communicated excitement about the baby.  Her affect brighten every time she spoke about her baby.   Informed that they have spoken with the medical team and infant is doing great.   Paternal relatives are reportedly very supportive.  No acute social concerns related at this time.     VI SOCIAL WORK PLAN Social Work Plan  Psychosocial Support/Ongoing Assessment of Needs   Robby Pirani J, LCSW  

## 2013-03-09 NOTE — Lactation Note (Signed)
This note was copied from the chart of Meghan Mikita Lesmeister. Lactation Consultation Note: Follow up visit before DC. Mom just back from NICU and was able to hold baby for the first time. Reports that she pumped several times yesterday but only once so far today, Encouraged her to pump now since she just held baby. Does not have $30 for [facility] loaner so plans to use manual pump while at home and DEBP when here visiting baby. To call [facility] Monday morning. No questions at present. To call prn  Patient Name: Meghan Salazar [id number] Date: 03/09/2013 Reason for consult: Follow-up assessment   Maternal Data    Feeding    LATCH Score/Interventions                      Lactation Tools Discussed/Used     Consult Status Consult Status: Complete    Pamelia Hoit 03/09/2013, 12:02 PM

## 2013-03-09 NOTE — Discharge Summary (Signed)
Obstetric Discharge Summary Reason for Admission: onset of labor Prenatal Procedures: NST and ultrasound Intrapartum Procedures: spontaneous vaginal delivery Postpartum Procedures: none Complications-Operative and Postpartum: none Hemoglobin  Date Value Range Status  03/07/2013 11.4* 12.0 - 15.0 g/dL Final     HCT  Date Value Range Status  03/07/2013 31.7* 36.0 - 46.0 % Final   Delivery Note I was called to assess pt who suddenly developed an urge to push.  She was noted to be C/C/+2.  She pushed once and BOW protruded out of introitus.  AROM with clear fluid.  Over the next push, at 11:29 PM a viable female was delivered via Vaginal, Spontaneous Delivery (Presentation: ; Occiput Anterior).  APGAR: 8, 8; weight pending.  After the delivery of the baby, who was quite vigorous, he was held below the level of the introitus for ~ 45 seconds, dried and stimulated, and the cord milked 3 times towards the umbilicus.  THe cord was doubly clamped and cut, and baby taken to the warmer where the NICU team assumed care.   Placenta status: Intact, Spontaneous.  THe MgSO4 was turned off.  THe uterus had some bogginess to it, so of cytotec was placed PR.  Placenta sent to pathology.  Cord: 3 vessels with the following complications: None.  Cord pH: pending Anesthesia: None  Episiotomy: None Lacerations: None Suture Repair: n/a Est. Blood Loss (mL): 350 cc Mom to postpartum.  Baby to NICU.  CRESENZO-DISHMAN,FRANCES 03/07/2013, 11:56 PM  Brief Hospital Course:  Patient was admitted at [redacted]w[redacted]d for vaginal bleeding and abdominal cramping in the setting of previously diagnosed shortened cervix at 1.1 cm and insufficient prenatal care (no visits since 11/2012) . She was seen in MAU on 10/19 and received BMZ x 2 doses. On exam, she was noted to have hourglassing membranes.  Magnesium sulfate was started but patient progressed in labor and had an SVD of a viable infant , see delivery note above.  She had an  uncomplicated postpartum course and was discharged to home on PPD#2. She is breastfeeding and desires interval BTS for contraception, papers signed 03/08/13. She will follow up in clinic for postpartum check and BTS consult at 4 weeks.   Physical Exam:  BP 110/70  Pulse 81  Temp(Src) 98.5 F (36.9 C) (Oral)  Resp 18  Ht 5\' 8"  (1.727 m)  Wt 194 lb (87.998 kg)  BMI 29.5 kg/m2  SpO2 98%  LMP 08/14/2012 General: alert and no distress Lochia: appropriate Uterine Fundus: firm DVT Evaluation: No evidence of DVT seen on physical exam. Negative Homan's sign.  Discharge Diagnoses: Premature labor  and delivery at [redacted]w[redacted]d  Discharge Information: Date: 03/09/2013 Activity: pelvic rest and refer to discharge instructions Diet: routine Medications: PNV, Ibuprofen, Colace, Iron and Percocet Condition: stable Instructions: refer to discharge instructions Discharge to: home  Follow-up Information   Follow up with [facility] In 4 weeks. (Postpartum check and tubal ligation consult)    Contact information:   [address] [place] [address]      Newborn Data: Live born female  Birth Weight: 2 lb 14.2 oz (1310 g) APGAR: 8, 8 Baby in NICU due to prematurity  Tereso Newcomer, MD 03/09/2013, 10:22 AM

## 2013-03-09 NOTE — Progress Notes (Signed)
Paged at patient request.  Chaplain had previously met patient on a prior visit to the Hospital regarding this same pregnancy.  It brought comfort to the patient to have a chaplain she knew.  Conversation centered on the future, and using the past to affirm the future, as God odes not forsake someone at any given point in time.  The phrase, "It may not be easy, but you will never be alone," was coined as a "mantra" for her upon which to center. Discussion also went to the fact that her former fears and anxiety over existing situations and what prompted the Chaplain's last visit weeks back, were situationally not going to change, so we reviewed the the interventions and attitudes discussed at our last meeting.  As labor became more intensive, the Chaplain left her with a focus on the joy of what was happening, and the ultra-sound pictures which she had just had done, and was leaning on heavily, and finding joy in seeing the face of her child in those pictures.  Rema Jasmine, Chaplain Pager: [phone]

## 2013-03-09 NOTE — Progress Notes (Signed)
Pt d/c to NICU stable condition, then will ambulate to main entrance to meet family.for a  ride home. Educated pt on d/c instructions and medications. Pt verbalized understanding. Pt will make follow up appointment independently next week. Will go by [facility] office on Monday to pick up electric double pump.

## 2013-03-11 NOTE — H&P (Signed)
Attestation of Attending Supervision of Fellow: Evaluation and management procedures were performed by the Fellow under my supervision and collaboration.  I have reviewed the Fellow's note and chart, and I agree with the management and plan.    

## 2013-03-12 NOTE — MAU Provider Note (Signed)
Attestation of Attending Supervision of Advanced Practitioner (CNM/NP): Evaluation and management procedures were performed by the Advanced Practitioner under my supervision and collaboration. I have reviewed the Advanced Practitioner's note and chart, and I agree with the management and plan.  Kysa Calais H. 5:41 AM    

## 2013-04-04 ENCOUNTER — Ambulatory Visit: Payer: Medicaid Other | Admitting: Obstetrics & Gynecology

## 2013-04-15 ENCOUNTER — Encounter: Payer: Self-pay | Admitting: Obstetrics & Gynecology

## 2013-04-15 ENCOUNTER — Ambulatory Visit (INDEPENDENT_AMBULATORY_CARE_PROVIDER_SITE_OTHER): Payer: Medicaid Other | Admitting: Obstetrics & Gynecology

## 2013-04-15 DIAGNOSIS — Z3049 Encounter for surveillance of other contraceptives: Secondary | ICD-10-CM

## 2013-04-15 MED ORDER — MEDROXYPROGESTERONE ACETATE 104 MG/0.65ML ~~LOC~~ SUSP
104.0000 mg | Freq: Once | SUBCUTANEOUS | Status: AC
  Administered 2013-04-15: 104 mg via SUBCUTANEOUS

## 2013-04-15 MED ORDER — MEDROXYPROGESTERONE ACETATE 150 MG/ML IM SUSP: 150.0000 mg | INTRAMUSCULAR | Status: DC

## 2013-04-15 NOTE — Progress Notes (Signed)
   Subjective:    Patient ID: Drue Stager, female    DOB: 14-Mar-1986, 27 y.o.   MRN: [id number]  HPI  27 yo S AA P3 (1 month son, 13 yo son, 1 yo daughter) here 1 month pp s/p NSVD of a 29 week PTD. She says that the baby is coming home tomorrow! She is doing well, normal bowel and bladder function. She is pumping breast milk for the baby. She had not had sex since delivery. She would a depo provera shot today to last until her BTL. She denies any symptoms of depresssion.  Review of Systems She reports all normal paps, done at the health dept. She had TDAP and flu vaccines during pregnancy.    Objective:   Physical Exam  normal      Assessment & Plan:  Post partum- doing well Contraception- depo provera today and I have sent an email to [place] to schedule a lap BS. She declines Filsche clips.  She understands the risks of surgery, including, but not to infection, bleeding, DVTs, damage to bowel, bladder, ureters. She wishes to proceed.

## 2013-05-14 ENCOUNTER — Encounter (HOSPITAL_COMMUNITY): Payer: Self-pay | Admitting: Pharmacist

## 2013-05-28 ENCOUNTER — Ambulatory Visit (HOSPITAL_COMMUNITY)
Admission: RE | Admit: 2013-05-28 | Discharge: 2013-05-28 | Disposition: A | Discharge status: Home / Self Care | Payer: Medicaid Other | Source: Ambulatory Visit | Attending: Obstetrics & Gynecology | Admitting: Obstetrics & Gynecology

## 2013-05-28 ENCOUNTER — Encounter (HOSPITAL_COMMUNITY)
Admission: RE | Disposition: A | Discharge status: Home / Self Care | Payer: Self-pay | Source: Ambulatory Visit | Attending: Obstetrics & Gynecology

## 2013-05-28 ENCOUNTER — Encounter (HOSPITAL_COMMUNITY): Payer: Medicaid Other | Admitting: Anesthesiology

## 2013-05-28 ENCOUNTER — Ambulatory Visit (HOSPITAL_COMMUNITY): Payer: Medicaid Other | Admitting: Anesthesiology

## 2013-05-28 ENCOUNTER — Encounter (HOSPITAL_COMMUNITY): Payer: Self-pay | Admitting: *Deleted

## 2013-05-28 DIAGNOSIS — Z3009 Encounter for other general counseling and advice on contraception: Secondary | ICD-10-CM

## 2013-05-28 DIAGNOSIS — O0081 Other ectopic pregnancy with intrauterine pregnancy: Secondary | ICD-10-CM

## 2013-05-28 DIAGNOSIS — Z302 Encounter for sterilization: Secondary | ICD-10-CM | POA: Insufficient documentation

## 2013-05-28 HISTORY — PX: LAPAROSCOPIC BILATERAL SALPINGECTOMY: SHX5889

## 2013-05-28 LAB — CBC
HEMATOCRIT: 38.7 % (ref 36.0–46.0)
Hemoglobin: 13.1 g/dL (ref 12.0–15.0)
MCH: 30.1 pg (ref 26.0–34.0)
MCHC: 33.9 g/dL (ref 30.0–36.0)
MCV: 89 fL (ref 78.0–100.0)
Platelets: 183 10*3/uL (ref 150–400)
RBC: 4.35 MIL/uL (ref 3.87–5.11)
RDW: 13.2 % (ref 11.5–15.5)
WBC: 8 10*3/uL (ref 4.0–10.5)

## 2013-05-28 LAB — PREGNANCY, URINE: Preg Test, Ur: NEGATIVE

## 2013-05-28 SURGERY — SALPINGECTOMY, BILATERAL, LAPAROSCOPIC
Anesthesia: General | Site: Abdomen | Laterality: Bilateral

## 2013-05-28 MED ORDER — ONDANSETRON HCL 4 MG/2ML IJ SOLN
INTRAMUSCULAR | Status: DC | PRN
  Administered 2013-05-28: 4 mg via INTRAVENOUS

## 2013-05-28 MED ORDER — KETOROLAC TROMETHAMINE 30 MG/ML IJ SOLN: 15.0000 mg | Freq: Once | INTRAMUSCULAR | Status: DC | PRN

## 2013-05-28 MED ORDER — MIDAZOLAM HCL 5 MG/5ML IJ SOLN
INTRAMUSCULAR | Status: DC | PRN
  Administered 2013-05-28: 2 mg via INTRAVENOUS

## 2013-05-28 MED ORDER — PROPOFOL 10 MG/ML IV EMUL
INTRAVENOUS | Status: AC
  Filled 2013-05-28: qty 20

## 2013-05-28 MED ORDER — BUPIVACAINE HCL (PF) 0.5 % IJ SOLN
INTRAMUSCULAR | Status: AC
  Filled 2013-05-28: qty 30

## 2013-05-28 MED ORDER — NEOSTIGMINE METHYLSULFATE 1 MG/ML IJ SOLN
INTRAMUSCULAR | Status: AC
  Filled 2013-05-28: qty 1

## 2013-05-28 MED ORDER — FENTANYL CITRATE 0.05 MG/ML IJ SOLN: 25.0000 ug | INTRAMUSCULAR | Status: DC | PRN

## 2013-05-28 MED ORDER — OXYCODONE-ACETAMINOPHEN 5-325 MG PO TABS
1.0000 | ORAL_TABLET | ORAL | Status: DC | PRN
  Administered 2013-05-28: 1 via ORAL

## 2013-05-28 MED ORDER — GLYCOPYRROLATE 0.2 MG/ML IJ SOLN
INTRAMUSCULAR | Status: AC
  Filled 2013-05-28: qty 4

## 2013-05-28 MED ORDER — MIDAZOLAM HCL 2 MG/2ML IJ SOLN
INTRAMUSCULAR | Status: AC
  Filled 2013-05-28: qty 2

## 2013-05-28 MED ORDER — OXYCODONE-ACETAMINOPHEN 5-325 MG PO TABS: 1.0000 | ORAL_TABLET | ORAL | Status: DC | PRN

## 2013-05-28 MED ORDER — LIDOCAINE HCL (CARDIAC) 20 MG/ML IV SOLN
INTRAVENOUS | Status: AC
  Filled 2013-05-28: qty 5

## 2013-05-28 MED ORDER — DEXAMETHASONE SODIUM PHOSPHATE 10 MG/ML IJ SOLN
INTRAMUSCULAR | Status: DC | PRN
  Administered 2013-05-28: 10 mg via INTRAVENOUS

## 2013-05-28 MED ORDER — GLYCOPYRROLATE 0.2 MG/ML IJ SOLN
INTRAMUSCULAR | Status: DC | PRN
  Administered 2013-05-28: .4 mg via INTRAVENOUS
  Administered 2013-05-28: 0.2 mg via INTRAVENOUS

## 2013-05-28 MED ORDER — KETOROLAC TROMETHAMINE 30 MG/ML IJ SOLN
INTRAMUSCULAR | Status: DC | PRN
  Administered 2013-05-28: 30 mg via INTRAVENOUS

## 2013-05-28 MED ORDER — LACTATED RINGERS IV SOLN
INTRAVENOUS | Status: DC
  Administered 2013-05-28 (×2): via INTRAVENOUS

## 2013-05-28 MED ORDER — PROPOFOL 10 MG/ML IV BOLUS
INTRAVENOUS | Status: DC | PRN
  Administered 2013-05-28: 200 mg via INTRAVENOUS

## 2013-05-28 MED ORDER — OXYCODONE-ACETAMINOPHEN 5-325 MG PO TABS
ORAL_TABLET | ORAL | Status: AC
  Filled 2013-05-28: qty 1

## 2013-05-28 MED ORDER — NEOSTIGMINE METHYLSULFATE 1 MG/ML IJ SOLN
INTRAMUSCULAR | Status: DC | PRN
  Administered 2013-05-28: 3 mg via INTRAVENOUS

## 2013-05-28 MED ORDER — FENTANYL CITRATE 0.05 MG/ML IJ SOLN
INTRAMUSCULAR | Status: DC | PRN
  Administered 2013-05-28: 100 ug via INTRAVENOUS
  Administered 2013-05-28: 50 ug via INTRAVENOUS
  Administered 2013-05-28: 100 ug via INTRAVENOUS

## 2013-05-28 MED ORDER — MEPERIDINE HCL 25 MG/ML IJ SOLN: 6.2500 mg | INTRAMUSCULAR | Status: DC | PRN

## 2013-05-28 MED ORDER — DEXAMETHASONE SODIUM PHOSPHATE 10 MG/ML IJ SOLN
INTRAMUSCULAR | Status: AC
  Filled 2013-05-28: qty 1

## 2013-05-28 MED ORDER — BUPIVACAINE HCL (PF) 0.5 % IJ SOLN
INTRAMUSCULAR | Status: DC | PRN
  Administered 2013-05-28: 11 mL

## 2013-05-28 MED ORDER — KETOROLAC TROMETHAMINE 30 MG/ML IJ SOLN
INTRAMUSCULAR | Status: AC
  Filled 2013-05-28: qty 1

## 2013-05-28 MED ORDER — ONDANSETRON HCL 4 MG/2ML IJ SOLN
INTRAMUSCULAR | Status: AC
  Filled 2013-05-28: qty 2

## 2013-05-28 MED ORDER — ROCURONIUM BROMIDE 100 MG/10ML IV SOLN
INTRAVENOUS | Status: DC | PRN
  Administered 2013-05-28: 20 mg via INTRAVENOUS

## 2013-05-28 MED ORDER — ONDANSETRON HCL 4 MG/2ML IJ SOLN: 4.0000 mg | Freq: Once | INTRAMUSCULAR | Status: DC | PRN

## 2013-05-28 MED ORDER — LIDOCAINE HCL (CARDIAC) 20 MG/ML IV SOLN
INTRAVENOUS | Status: DC | PRN
  Administered 2013-05-28: 40 mg via INTRAVENOUS

## 2013-05-28 MED ORDER — FENTANYL CITRATE 0.05 MG/ML IJ SOLN
INTRAMUSCULAR | Status: AC
  Filled 2013-05-28: qty 5

## 2013-05-28 MED ORDER — IBUPROFEN 800 MG PO TABS: 800.0000 mg | ORAL_TABLET | Freq: Three times a day (TID) | ORAL | Status: DC | PRN

## 2013-05-28 SURGICAL SUPPLY — 32 items
APPLICATOR COTTON TIP 6IN STRL (MISCELLANEOUS) ×3 IMPLANT
CABLE HIGH FREQUENCY MONO STRZ (ELECTRODE) IMPLANT
CATH ROBINSON RED A/P 16FR (CATHETERS) ×3 IMPLANT
CLOSURE WOUND 1/2 X4 (GAUZE/BANDAGES/DRESSINGS) ×1
CLOTH BEACON ORANGE TIMEOUT ST (SAFETY) ×3 IMPLANT
DRSG COVADERM PLUS 2X2 (GAUZE/BANDAGES/DRESSINGS) ×3 IMPLANT
DURAPREP 26ML APPLICATOR (WOUND CARE) ×6 IMPLANT
ELECT REM PT RETURN 9FT ADLT (ELECTROSURGICAL)
ELECTRODE REM PT RTRN 9FT ADLT (ELECTROSURGICAL) IMPLANT
FORCEPS CUTTING 33CM 5MM (CUTTING FORCEPS) IMPLANT
GLOVE BIO SURGEON STRL SZ 6.5 (GLOVE) ×2 IMPLANT
GLOVE BIO SURGEONS STRL SZ 6.5 (GLOVE) ×1
GOWN PREVENTION PLUS LG XLONG (DISPOSABLE) ×6 IMPLANT
NDL SAFETY ECLIPSE 18X1.5 (NEEDLE) ×1 IMPLANT
NEEDLE HYPO 18GX1.5 SHARP (NEEDLE) ×2
NEEDLE INSUFFLATION 120MM (ENDOMECHANICALS) ×3 IMPLANT
NS IRRIG 1000ML POUR BTL (IV SOLUTION) ×3 IMPLANT
PACK LAPAROSCOPY BASIN (CUSTOM PROCEDURE TRAY) ×3 IMPLANT
POUCH SPECIMEN RETRIEVAL 10MM (ENDOMECHANICALS) IMPLANT
PROTECTOR NERVE ULNAR (MISCELLANEOUS) ×3 IMPLANT
SCALPEL HARMONIC ACE (MISCELLANEOUS) IMPLANT
SET IRRIG TUBING LAPAROSCOPIC (IRRIGATION / IRRIGATOR) IMPLANT
STRIP CLOSURE SKIN 1/2X4 (GAUZE/BANDAGES/DRESSINGS) ×2 IMPLANT
SUT VICRYL 0 ENDOLOOP (SUTURE) IMPLANT
SUT VICRYL 0 UR6 27IN ABS (SUTURE) ×3 IMPLANT
SUT VICRYL 4-0 PS2 18IN ABS (SUTURE) ×6 IMPLANT
TOWEL OR 17X24 6PK STRL BLUE (TOWEL DISPOSABLE) ×6 IMPLANT
TROCAR OPTI TIP 5M 100M (ENDOMECHANICALS) ×3 IMPLANT
TROCAR XCEL DIL TIP R 11M (ENDOMECHANICALS) ×3 IMPLANT
TROCAR XCEL NON-BLD 5MMX100MML (ENDOMECHANICALS) IMPLANT
WARMER LAPAROSCOPE (MISCELLANEOUS) ×3 IMPLANT
WATER STERILE IRR 1000ML POUR (IV SOLUTION) ×3 IMPLANT

## 2013-05-28 NOTE — Op Note (Signed)
05/28/2013  11:05 AM  PATIENT:  Meghan StagerAnissa M Salazar  28 y.o. female  PRE-OPERATIVE DIAGNOSIS:  Desires Sterilization  POST-OPERATIVE DIAGNOSIS:  Desires Sterilization  PROCEDURE:  Procedure(s): LAPAROSCOPIC BILATERAL SALPINGECTOMY (Bilateral)  SURGEON:  Surgeon(s) and Role:    * Allie BossierMyra C Rett Stehlik, MD - Primary  PHYSICIAN ASSISTANT:   ASSISTANTS: none   ANESTHESIA:   general  EBL:  Total I/O In: 1000 [I.V.:1000] Out: -   BLOOD ADMINISTERED:none  DRAINS: none   LOCAL MEDICATIONS USED:  MARCAINE     SPECIMEN:  Source of Specimen:  both oviducts  DISPOSITION OF SPECIMEN:  PATHOLOGY  COUNTS:  YES  TOURNIQUET:  * No tourniquets in log *  DICTATION: .Dragon Dictation  PLAN OF CARE: Discharge to home after PACU  PATIENT DISPOSITION:  PACU - hemodynamically stable.   Delay start of Pharmacological VTE agent (>24hrs) due to surgical blood loss or risk of bleeding: not applicable  The risks, benefits, and alternatives of surgery were explained, understood, accepted. She is certain that she wants permanent sterility. She understands that this is not reversible. She understands there is a small failure rate of this procedure. She also would like to have both oviducts removed her due newest recommendations to help prevent ovarian/peritoneal cancer. In the operating room she was placed in the dorsal lithotomy position, and general anesthesia was given without complication. Her abdomen and vagina were prepped and draped in the usual sterile fashion. A timeout procedure was done. A bimanual exam revealed a small anteverted and mobile uterus. Her adnexa felt normal. A Hulka manipulator was placed after a Mirena IUD was removed and noted to be intact. Her bladder was emptied with a Robinson catheter. Gloves were changed, and attention was turned to the abdomen. Approximately the 5 mL of 0.5% Marcaine was injected into the umbilicus. A vertical incision was made at the site. A varies needle was  placed intraperitoneally. Low-flow CO2 was used to insufflate the abdomen to approximately 3-1/2 L. Once a good pneumoperitoneum was established, a 10 mm Excel trocar was placed. Laparoscopy confirmed correct placement.a 5 mm port was placed in each lower quadrant under direct laparoscopic visualization after injecting 0.5% Marcaine in the incision sites. Her pelvis and upper abdomen appeared normal as did her appendix A Harmonic scapel was used to hemostatically removed both oviducts. I switched to a 5 mm camera and removed the oviducts through the umbilical port.  I removed the 5 mm ports and noted hemostasis. The umbilical fascia was closed with a 0 vicryl suture. No defects were palpable. A subcuticular closure was done with 4-0 Vicryl suture at all incision sites. A Steri-Strip was placed across each incision. She was extubated and taken to the recovery room in stable condition.

## 2013-05-28 NOTE — H&P (Signed)
Meghan Salazar is an 28 y.o. S AA G3P3 (3,1, 8 weeks)  female who is here today for a laparoscopic bilateral salpingectomy for permanent sterility. She is "POSITIVE" that she does not want more kids. She describes her periods as lasting days per month. She is currently on depo provera. She has used OCPs also in the past as well as condoms.     Menstrual History: Menarche age: [age] No LMP recorded. Patient is not currently having periods (Reason: Lactating).    Past Medical History  Diagnosis Date  . Asthma   . Other and unspecified ovarian cysts   . Infection     uti  . Abnormal Pap smear     biopsy    Past Surgical History  Procedure Laterality Date  . No past surgeries      Family History  Problem Relation Age of Onset  . Diabetes Maternal Grandmother   . Hypertension Paternal Grandmother   . Diabetes Paternal Grandmother   She has a great aunt that died from ovarian cancer.  Social History:  reports that she quit smoking about 22 months ago. Her smoking use included Cigarettes. She smoked 0.25 packs per day. She has never used smokeless tobacco. She reports that she drinks alcohol. She reports that she does not use illicit drugs.  Allergies: No Known Allergies  Prescriptions prior to admission  Medication Sig Dispense Refill  . Prenatal Vit-Fe Fumarate-FA (PRENATAL MULTIVITAMIN) TABS Take 1 tablet by mouth at bedtime.       . ferrous sulfate 325 (65 FE) MG tablet Take 1 tablet (325 mg total) by mouth 2 (two) times daily with a meal.  60 tablet  3    ROS She is currently not employed. She lives with her kids.  Blood pressure 126/79, pulse 55, temperature 97.7 F (36.5 C), temperature source Oral, resp. rate 20, height 5\' 6"  (1.676 m), weight 89.812 kg (198 lb), SpO2 98.00%. Physical Exam Heart- rrr Lungs- CTAB Abd- benign  Results for orders placed during the hospital encounter of 05/28/13 (from the past 24 hour(s))  CBC     Status: None   Collection Time   05/28/13  9:20 AM      Result Value Range   WBC 8.0  4.0 - 10.5 K/uL   RBC 4.35  3.87 - 5.11 MIL/uL   Hemoglobin 13.1  12.0 - 15.0 g/dL   HCT 53.638.7  64.436.0 - 03.446.0 %   MCV 89.0  78.0 - 100.0 fL   MCH 30.1  26.0 - 34.0 pg   MCHC 33.9  30.0 - 36.0 g/dL   RDW 74.213.2  59.511.5 - 63.815.5 %   Platelets 183  150 - 400 K/uL  PREGNANCY, URINE     Status: None   Collection Time    05/28/13  9:20 AM      Result Value Range   Preg Test, Ur NEGATIVE  NEGATIVE    No results found.  Assessment/Plan: She desires permanent sterility in the form of a bilateral salpingectomy. She is happy about the decrease future risk of ovarian cancer. She understands that this is absolutely NOT reversible.  She understands the risks of surgery, including, but not to infection, bleeding, DVTs, damage to bowel, bladder, ureters. She wishes to proceed.     Meghan Salazar C. 05/28/2013, 9:56 AM

## 2013-05-28 NOTE — Transfer of Care (Signed)
Immediate Anesthesia Transfer of Care Note  Patient: Meghan Salazar  Procedure(s) Performed: Procedure(s): LAPAROSCOPIC BILATERAL SALPINGECTOMY (Bilateral)  Patient Location: PACU  Anesthesia Type:General  Level of Consciousness: awake  Airway & Oxygen Therapy: Patient Spontanous Breathing and Patient connected to nasal cannula oxygen  Post-op Assessment: Report given to PACU RN and Post -op Vital signs reviewed and stable  Post vital signs: stable  Complications: No apparent anesthesia complications

## 2013-05-28 NOTE — Anesthesia Preprocedure Evaluation (Signed)
Anesthesia Evaluation  Patient identified by MRN, date of birth, ID band Patient awake    Reviewed: Allergy & Precautions, H&P , NPO status , Patient's Chart, lab work & pertinent test results, reviewed documented beta blocker date and time   Airway Mallampati: I TM Distance: >3 FB Neck ROM: full    Dental no notable dental hx. (+) Teeth Intact   Pulmonary former smoker,    Pulmonary exam normal       Cardiovascular negative cardio ROS      Neuro/Psych negative neurological ROS  negative psych ROS   GI/Hepatic negative GI ROS, Neg liver ROS,   Endo/Other  negative endocrine ROS  Renal/GU      Musculoskeletal negative musculoskeletal ROS (+)   Abdominal Normal abdominal exam  (+)   Peds  Hematology negative hematology ROS (+)   Anesthesia Other Findings   Reproductive/Obstetrics negative OB ROS                           Anesthesia Physical Anesthesia Plan  ASA: II  Anesthesia Plan: General   Post-op Pain Management:    Induction: Intravenous  Airway Management Planned: Oral ETT  Additional Equipment:   Intra-op Plan:   Post-operative Plan: Extubation in OR  Informed Consent: I have reviewed the patients History and Physical, chart, labs and discussed the procedure including the risks, benefits and alternatives for the proposed anesthesia with the patient or authorized representative who has indicated his/her understanding and acceptance.   Dental Advisory Given  Plan Discussed with: CRNA and Surgeon  Anesthesia Plan Comments:         Anesthesia Quick Evaluation

## 2013-05-28 NOTE — Discharge Instructions (Signed)
Diagnostic Laparoscopy Laparoscopy is a surgical procedure. It is used to diagnose and treat diseases inside the belly (abdomen). It is usually a brief, common, and relatively simple procedure. The laparoscopeis a thin, lighted, pencil-sized instrument. It is like a telescope. It is inserted into your abdomen through a small cut (incision). Your caregiver can look at the organs inside your body through this instrument. He or she can see if there is anything abnormal. Laparoscopy can be done either in a hospital or outpatient clinic. You may be given a mild sedative to help you relax before the procedure. Once in the operating room, you will be given a drug to make you sleep (general anesthesia). Laparoscopy usually lasts less than 1 hour. After the procedure, you will be monitored in a recovery area until you are stable and doing well. Once you are home, it will take 2 to 3 days to fully recover. RISKS AND COMPLICATIONS  Laparoscopy has relatively few risks. Your caregiver will discuss the risks with you before the procedure. Some problems that can occur include:  Infection.  Bleeding.  Damage to other organs.  Anesthetic side effects. PROCEDURE Once you receive anesthesia, your surgeon inflates the abdomen with a harmless gas (carbon dioxide). This makes the organs easier to see. The laparoscope is inserted into the abdomen through a small incision. This allows your surgeon to see into the abdomen. Other small instruments are also inserted into the abdomen through other small openings. Many surgeons attach a video camera to the laparoscope to enlarge the view. During a diagnostic laparoscopy, the surgeon may be looking for inflammation, infection, or cancer. Your surgeon may take tissue samples(biopsies). The samples are sent to a specialist in looking at cells and tissue samples (pathologist). The pathologist examines them under a microscope. Biopsies can help to diagnose or confirm a  disease. AFTER THE PROCEDURE   The gas is released from inside the abdomen.  The incisions are closed with stitches (sutures). Because these incisions are small (usually less than 1/2 inch), there is usually minimal discomfort after the procedure. There may be some mild discomfort in the throat. This is from the tube placed in the throat while you were sleeping. You may have some mild abdominal discomfort. There may also be discomfort from the instrument placement incisions in the abdomen.  The recovery time is shortened as long as there are no complications.  You will rest in a recovery room until stable and doing well. As long as there are no complications, you may be allowed to go home. FINDING OUT THE RESULTS OF YOUR TEST Not all test results are available during your visit. If your test results are not back during the visit, make an appointment with your caregiver to find out the results. Do not assume everything is normal if you have not heard from your caregiver or the medical facility. It is important for you to follow up on all of your test results. HOME CARE INSTRUCTIONS   Take all medicines as directed.  Only take over-the-counter or prescription medicines for pain, discomfort, or fever as directed by your caregiver.  Resume daily activities as directed.  Showers are preferred over baths.  You may resume sexual activities in 1 week or as directed.  Do not drive while taking narcotics. SEEK MEDICAL CARE IF:   There is increasing abdominal pain.  There is new pain in the shoulders (shoulder strap areas).  You feel lightheaded or faint.  You have the chills.  You or  your child has an oral temperature above 102° F (38.9° C). °· There is pus-like (purulent) drainage from any of the wounds. °· You are unable to pass gas or have a bowel movement. °· You feel sick to your stomach (nauseous) or throw up (vomit). °MAKE SURE YOU:  °· Understand these instructions. °· Will watch  your condition. °· Will get help right away if you are not doing well or get worse. °Document Released: 08/08/2000 Document Revised: 08/27/2012 Document Reviewed: 05/02/2007 °ExitCare® Patient Information ©2014 [employer]. ° °Post Anesthesia Home Care Instructions ° °Activity: °Get plenty of rest for the remainder of the day. A responsible adult should stay with you for 24 hours following the procedure.  °For the next 24 hours, DO NOT: °-Drive a car °-Operate machinery °-Drink alcoholic beverages °-Take any medication unless instructed by your physician °-Make any legal decisions or sign important papers. ° °Meals: °Start with liquid foods such as gelatin or soup. Progress to regular foods as tolerated. Avoid greasy, spicy, heavy foods. If nausea and/or vomiting occur, drink only clear liquids until the nausea and/or vomiting subsides. Call your physician if vomiting continues. ° °Special Instructions/Symptoms: °Your throat may feel dry or sore from the anesthesia or the breathing tube placed in your throat during surgery. If this causes discomfort, gargle with warm salt water. The discomfort should disappear within 24 hours. ° °

## 2013-05-28 NOTE — Anesthesia Postprocedure Evaluation (Signed)
Anesthesia Post Note  Patient: Meghan Salazar  Procedure(s) Performed: Procedure(s) (LRB): LAPAROSCOPIC BILATERAL SALPINGECTOMY (Bilateral)  Anesthesia type: General  Patient location: PACU  Post pain: Pain level controlled  Post assessment: Post-op Vital signs reviewed  Last Vitals:  Filed Vitals:   05/28/13 1130  BP: 123/81  Pulse: 58  Temp:   Resp: 14    Post vital signs: Reviewed  Level of consciousness: sedated  Complications: No apparent anesthesia complications

## 2013-05-29 ENCOUNTER — Encounter (HOSPITAL_COMMUNITY): Payer: Self-pay | Admitting: Obstetrics & Gynecology

## 2013-08-23 NOTE — MAU Provider Note (Signed)
`````  Attestation of Attending Supervision of Advanced Practitioner: Evaluation and management procedures were performed by the PA/NP/CNM/OB Fellow under my supervision/collaboration. Chart reviewed and agree with management and plan.  Meghan BurrowJohn V Zebedee Segundo 08/23/2013 7:26 AM

## 2014-01-08 ENCOUNTER — Emergency Department (HOSPITAL_COMMUNITY): Payer: No Typology Code available for payment source

## 2014-01-08 ENCOUNTER — Encounter (HOSPITAL_COMMUNITY): Payer: Self-pay | Admitting: Emergency Medicine

## 2014-01-08 ENCOUNTER — Emergency Department (HOSPITAL_COMMUNITY)
Admission: EM | Admit: 2014-01-08 | Discharge: 2014-01-09 | Disposition: A | Discharge status: Home / Self Care | Payer: No Typology Code available for payment source | Attending: Emergency Medicine | Admitting: Emergency Medicine

## 2014-01-08 DIAGNOSIS — S0993XA Unspecified injury of face, initial encounter: Secondary | ICD-10-CM | POA: Diagnosis present

## 2014-01-08 DIAGNOSIS — Z79899 Other long term (current) drug therapy: Secondary | ICD-10-CM | POA: Diagnosis not present

## 2014-01-08 DIAGNOSIS — Z87891 Personal history of nicotine dependence: Secondary | ICD-10-CM | POA: Insufficient documentation

## 2014-01-08 DIAGNOSIS — Y92410 Unspecified street and highway as the place of occurrence of the external cause: Secondary | ICD-10-CM | POA: Insufficient documentation

## 2014-01-08 DIAGNOSIS — S161XXA Strain of muscle, fascia and tendon at neck level, initial encounter: Secondary | ICD-10-CM

## 2014-01-08 DIAGNOSIS — Z8742 Personal history of other diseases of the female genital tract: Secondary | ICD-10-CM | POA: Insufficient documentation

## 2014-01-08 DIAGNOSIS — Z87440 Personal history of urinary (tract) infections: Secondary | ICD-10-CM | POA: Diagnosis not present

## 2014-01-08 DIAGNOSIS — S199XXA Unspecified injury of neck, initial encounter: Secondary | ICD-10-CM

## 2014-01-08 DIAGNOSIS — S139XXA Sprain of joints and ligaments of unspecified parts of neck, initial encounter: Secondary | ICD-10-CM | POA: Diagnosis not present

## 2014-01-08 DIAGNOSIS — J45909 Unspecified asthma, uncomplicated: Secondary | ICD-10-CM | POA: Diagnosis not present

## 2014-01-08 DIAGNOSIS — S4980XA Other specified injuries of shoulder and upper arm, unspecified arm, initial encounter: Secondary | ICD-10-CM | POA: Diagnosis not present

## 2014-01-08 DIAGNOSIS — S46909A Unspecified injury of unspecified muscle, fascia and tendon at shoulder and upper arm level, unspecified arm, initial encounter: Secondary | ICD-10-CM | POA: Diagnosis not present

## 2014-01-08 DIAGNOSIS — Y9389 Activity, other specified: Secondary | ICD-10-CM | POA: Diagnosis not present

## 2014-01-08 MED ORDER — METHOCARBAMOL 500 MG PO TABS
750.0000 mg | ORAL_TABLET | Freq: Once | ORAL | Status: AC
  Administered 2014-01-08: 750 mg via ORAL
  Filled 2014-01-08: qty 2

## 2014-01-08 MED ORDER — IBUPROFEN 600 MG PO TABS: 600.0000 mg | ORAL_TABLET | Freq: Three times a day (TID) | ORAL | Status: DC | PRN

## 2014-01-08 MED ORDER — IBUPROFEN 400 MG PO TABS
600.0000 mg | ORAL_TABLET | Freq: Once | ORAL | Status: AC
  Administered 2014-01-08: 22:00:00 600 mg via ORAL
  Filled 2014-01-08 (×2): qty 1

## 2014-01-08 MED ORDER — HYDROCODONE-ACETAMINOPHEN 5-325 MG PO TABS: 1.0000 | ORAL_TABLET | Freq: Four times a day (QID) | ORAL | Status: DC | PRN

## 2014-01-08 MED ORDER — HYDROCODONE-ACETAMINOPHEN 5-325 MG PO TABS
1.0000 | ORAL_TABLET | Freq: Once | ORAL | Status: AC
  Administered 2014-01-08: 1 via ORAL
  Filled 2014-01-08: qty 1

## 2014-01-08 MED ORDER — METHOCARBAMOL 750 MG PO TABS: 750.0000 mg | ORAL_TABLET | Freq: Three times a day (TID) | ORAL | Status: DC
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
## 2014-01-09 NOTE — ED Provider Notes (Signed)
Medical screening examination/treatment/procedure(s) were performed by non-physician practitioner and as supervising physician I was immediately available for consultation/collaboration.   EKG Interpretation None        Cherylanne Ardelean, MD 01/09/14 0011 

## 2014-01-09 NOTE — ED Notes (Signed)
Discharge and follow up instructions reviewed with pt. Pt verbalized understanding.  

## 2014-01-09 NOTE — Progress Notes (Signed)
Orthopedic Tech Progress Note Patient Details:  Meghan Salazar 1985/05/25 [id number] Applied soft cervical collar. Ortho Devices Type of Ortho Device: Soft collar Ortho Device/Splint Interventions: Application   Lesle Chris 01/09/2014, 1:30 AM

## 2014-02-13 ENCOUNTER — Encounter (HOSPITAL_COMMUNITY): Payer: Self-pay | Admitting: Emergency Medicine

## 2014-02-13 ENCOUNTER — Emergency Department (HOSPITAL_COMMUNITY)
Admission: EM | Admit: 2014-02-13 | Discharge: 2014-02-13 | Disposition: A | Discharge status: Home / Self Care | Payer: Medicaid Other | Attending: Emergency Medicine | Admitting: Emergency Medicine

## 2014-02-13 DIAGNOSIS — J45909 Unspecified asthma, uncomplicated: Secondary | ICD-10-CM | POA: Diagnosis not present

## 2014-02-13 DIAGNOSIS — Z79899 Other long term (current) drug therapy: Secondary | ICD-10-CM | POA: Insufficient documentation

## 2014-02-13 DIAGNOSIS — Z8742 Personal history of other diseases of the female genital tract: Secondary | ICD-10-CM | POA: Diagnosis not present

## 2014-02-13 DIAGNOSIS — R21 Rash and other nonspecific skin eruption: Secondary | ICD-10-CM | POA: Diagnosis present

## 2014-02-13 DIAGNOSIS — Z87440 Personal history of urinary (tract) infections: Secondary | ICD-10-CM | POA: Insufficient documentation

## 2014-02-13 DIAGNOSIS — Z87891 Personal history of nicotine dependence: Secondary | ICD-10-CM | POA: Insufficient documentation

## 2014-02-13 DIAGNOSIS — B86 Scabies: Secondary | ICD-10-CM

## 2014-02-13 MED ORDER — PERMETHRIN 5 % EX CREA: TOPICAL_CREAM | CUTANEOUS | Status: DC

## 2014-02-13 NOTE — ED Notes (Signed)
Pt c/o small rash to left hand; pt sts son recently diagnosed with scabies

## 2014-02-13 NOTE — Discharge Instructions (Signed)
Apply permethrin cream, leave on for 8-10 hours, rinse off.  Scabies Scabies are small bugs (mites) that burrow under the skin and cause red bumps and severe itching. These bugs can only be seen with a microscope. Scabies are highly contagious. They can spread easily from person to person by direct contact. They are also spread through sharing clothing or linens that have the scabies mites living in them. It is not unusual for an entire family to become infected through shared towels, clothing, or bedding.  HOME CARE INSTRUCTIONS   Your caregiver may prescribe a cream or lotion to kill the mites. If cream is prescribed, massage the cream into the entire body from the neck to the bottom of both feet. Also massage the cream into the scalp and face if your child is less than 27 year old. Avoid the eyes and mouth. Do not wash your hands after application.  Leave the cream on for 8 to 12 hours. Your child should bathe or shower after the 8 to 12 hour application period. Sometimes it is helpful to apply the cream to your child right before bedtime.  One treatment is usually effective and will eliminate approximately 95% of infestations. For severe cases, your caregiver may decide to repeat the treatment in 1 week. Everyone in your household should be treated with one application of the cream.  New rashes or burrows should not appear within 24 to 48 hours after successful treatment. However, the itching and rash may last for 2 to 4 weeks after successful treatment. Your caregiver may prescribe a medicine to help with the itching or to help the rash go away more quickly.  Scabies can live on clothing or linens for up to 3 days. All of your child's recently used clothing, towels, stuffed toys, and bed linens should be washed in hot water and then dried in a dryer for at least 20 minutes on high heat. Items that cannot be washed should be enclosed in a plastic bag for at least 3 days.  To help relieve itching,  bathe your child in a cool bath or apply cool washcloths to the affected areas.  Your child may return to school after treatment with the prescribed cream. SEEK MEDICAL CARE IF:   The itching persists longer than 4 weeks after treatment.  The rash spreads or becomes infected. Signs of infection include red blisters or yellow-tan crust. Document Released: 05/02/2005 Document Revised: 07/25/2011 Document Reviewed: 09/10/2008 [facility] Patient Information 2015 [employer] [place]. This information is not intended to replace advice given to you by your health care provider. Make sure you discuss any questions you have with your health care provider.

## 2014-02-13 NOTE — ED Provider Notes (Signed)
Medical screening examination/treatment/procedure(s) were performed by non-physician practitioner and as supervising physician I was immediately available for consultation/collaboration.  Mikaiya Tramble, MD 02/13/14 1527 

## 2014-02-13 NOTE — ED Provider Notes (Signed)
CSN: [id number]     Arrival date & time 02/13/14  1220 History  This chart was scribed for non-physician practitioner, Johnnette Gourdobyn Albert, PA-C, working with Gerhard Munchobert Lockwood, MD by Charline BillsEssence Howell, ED Scribe. This patient was seen in room TR06C/TR06C and the patient's care was started at 12:32 PM.   Chief Complaint  Patient presents with  . Rash   The history is provided by the patient. No language interpreter was used.   HPI Comments: Meghan Salazar is a 28 y.o. female who presents to the Emergency Department complaining of rash to L hand first noted upon waking. Pt describes the rash as an itching sensation. Pt's son was diagnosed with scabies 02/09/14, which she suspects is from daycare. She has applied Vaseline without relief.   Past Medical History  Diagnosis Date  . Asthma   . Other and unspecified ovarian cysts   . Infection     uti  . Abnormal Pap smear     biopsy   Past Surgical History  Procedure Laterality Date  . No past surgeries    . Laparoscopic bilateral salpingectomy Bilateral 05/28/2013    Procedure: LAPAROSCOPIC BILATERAL SALPINGECTOMY;  Surgeon: Allie BossierMyra C Dove, MD;  Location: [facility];  Service: Gynecology;  Laterality: Bilateral;   Family History  Problem Relation Age of Onset  . Diabetes Maternal Grandmother   . Hypertension Paternal Grandmother   . Diabetes Paternal Grandmother    History  Substance Use Topics  . Smoking status: Former Smoker -- 0.25 packs/day    Types: Cigarettes    Quit date: 07/27/2011  . Smokeless tobacco: Never Used  . Alcohol Use: Yes     Comment: occasional wine cooler   OB History   Grav Para Term Preterm Abortions TAB SAB Ect Mult Living   3 3 2 1      3      Review of Systems  Skin: Positive for rash.  All other systems reviewed and are negative.  Allergies  Review of patient's allergies indicates no known allergies.  Home Medications   Prior to Admission medications   Medication Sig Start Date End Date Taking? Authorizing  Provider  albuterol (PROVENTIL HFA;VENTOLIN HFA) 108 (90 BASE) MCG/ACT inhaler Inhale into the lungs every 6 (six) hours as needed for wheezing or shortness of breath.    Historical Provider, MD  HYDROcodone-acetaminophen (NORCO/VICODIN) 5-325 MG per tablet Take 1 tablet by mouth every 6 (six) hours as needed for moderate pain. 01/08/14   Arman FilterGail K Schulz, NP  ibuprofen (ADVIL,MOTRIN) 600 MG tablet Take 1 tablet (600 mg total) by mouth every 8 (eight) hours as needed. 01/08/14   Arman FilterGail K Schulz, NP  methocarbamol (ROBAXIN) 750 MG tablet Take 1 tablet (750 mg total) by mouth 3 (three) times daily. 01/08/14   Arman FilterGail K Schulz, NP  permethrin (ELIMITE) 5 % cream Apply to affected area once 02/13/14   Trevor Maceobyn M Albert, PA-C   Triage Vitals: BP 139/97  Pulse 59  Temp(Src) 98 F (36.7 C) (Oral)  Resp 18  SpO2 99% Physical Exam  Nursing note and vitals reviewed. Constitutional: She is oriented to person, place, and time. She appears well-developed and well-nourished. No distress.  HENT:  Head: Normocephalic and atraumatic.  Mouth/Throat: Oropharynx is clear and moist.  Eyes: Conjunctivae and EOM are normal.  Neck: Normal range of motion. Neck supple.  Cardiovascular: Normal rate, regular rhythm and normal heart sounds.   Pulmonary/Chest: Effort normal and breath sounds normal. No respiratory distress.  Musculoskeletal: Normal range  of motion. She exhibits no edema.  Neurological: She is alert and oriented to person, place, and time. No sensory deficit.  Skin: Skin is warm and dry.  Tiny raised erythematous/white areas with small burrows scattered on left hand around knuckles and between web spaces of fingers, few on right hand, appearance of scabies. Spares palms.  Psychiatric: She has a normal mood and affect. Her behavior is normal.   ED Course  Procedures (including critical care time) DIAGNOSTIC STUDIES: Oxygen Saturation is 99% on RA, normal by my interpretation.    COORDINATION OF CARE: 12:35  PM-Discussed treatment plan which includes cream with pt at bedside and pt agreed to plan.   Labs Review Labs Reviewed - No data to display  Imaging Review No results found.   EKG Interpretation None      MDM   Final diagnoses:  Scabies   Pt well appearing and in NAD. Treat with permethrin. Infection care/precautions discussed. Stable for d/c. Return precautions given. Patient states understanding of treatment care plan and is agreeable.  I personally performed the services described in this documentation, which was scribed in my presence. The recorded information has been reviewed and is accurate.    Trevor Mace, PA-C 02/13/14 1239

## 2014-03-17 ENCOUNTER — Encounter (HOSPITAL_COMMUNITY): Payer: Self-pay | Admitting: Emergency Medicine

## 2014-04-08 ENCOUNTER — Emergency Department (HOSPITAL_COMMUNITY): Payer: Medicaid Other

## 2014-04-08 ENCOUNTER — Encounter (HOSPITAL_COMMUNITY): Payer: Self-pay | Admitting: Emergency Medicine

## 2014-04-08 ENCOUNTER — Emergency Department (HOSPITAL_COMMUNITY)
Admission: EM | Admit: 2014-04-08 | Discharge: 2014-04-08 | Disposition: A | Discharge status: Home / Self Care | Payer: Medicaid Other | Attending: Emergency Medicine | Admitting: Emergency Medicine

## 2014-04-08 DIAGNOSIS — Y998 Other external cause status: Secondary | ICD-10-CM | POA: Insufficient documentation

## 2014-04-08 DIAGNOSIS — Z720 Tobacco use: Secondary | ICD-10-CM | POA: Diagnosis not present

## 2014-04-08 DIAGNOSIS — Y9389 Activity, other specified: Secondary | ICD-10-CM | POA: Insufficient documentation

## 2014-04-08 DIAGNOSIS — Z8742 Personal history of other diseases of the female genital tract: Secondary | ICD-10-CM | POA: Insufficient documentation

## 2014-04-08 DIAGNOSIS — Y9289 Other specified places as the place of occurrence of the external cause: Secondary | ICD-10-CM | POA: Insufficient documentation

## 2014-04-08 DIAGNOSIS — W109XXA Fall (on) (from) unspecified stairs and steps, initial encounter: Secondary | ICD-10-CM | POA: Insufficient documentation

## 2014-04-08 DIAGNOSIS — S6991XA Unspecified injury of right wrist, hand and finger(s), initial encounter: Secondary | ICD-10-CM | POA: Diagnosis present

## 2014-04-08 DIAGNOSIS — J45909 Unspecified asthma, uncomplicated: Secondary | ICD-10-CM | POA: Diagnosis not present

## 2014-04-08 DIAGNOSIS — R200 Anesthesia of skin: Secondary | ICD-10-CM | POA: Diagnosis not present

## 2014-04-08 DIAGNOSIS — Z87440 Personal history of urinary (tract) infections: Secondary | ICD-10-CM | POA: Diagnosis not present

## 2014-04-08 DIAGNOSIS — Z79899 Other long term (current) drug therapy: Secondary | ICD-10-CM | POA: Diagnosis not present

## 2014-04-08 DIAGNOSIS — R52 Pain, unspecified: Secondary | ICD-10-CM

## 2014-04-08 MED ORDER — HYDROCODONE-ACETAMINOPHEN 5-325 MG PO TABS: 2.0000 | ORAL_TABLET | ORAL | Status: DC | PRN

## 2014-04-08 NOTE — Discharge Instructions (Signed)
Take vicodin as needed for pain. Wear splint as needed for support. Refer to attached documents for more information.

## 2014-04-08 NOTE — ED Provider Notes (Signed)
CSN: [id number]     Arrival date & time 04/08/14  1437 History  This chart was scribed for non-physician practitioner Emilia BeckKaitlyn Kebrina Friend, PA-C, working with Linwood DibblesJon Knapp, MD by Littie Deedsichard Sun, ED Scribe. This patient was seen in room TR08C/TR08C and the patient's care was started at 3:51 PM.      Chief Complaint  Patient presents with  . Wrist Pain    Patient is a 28 y.o. female presenting with wrist pain. The history is provided by the patient. No language interpreter was used.  Wrist Pain This is a new problem. The problem occurs constantly. The problem has not changed since onset.Nothing aggravates the symptoms. Nothing relieves the symptoms. She has tried nothing for the symptoms.   HPI Comments: Drue Stagernissa M Strebeck is a 28 y.o. female who presents to the Emergency Department complaining of sudden onset right wrist pain radiating to her right arm that began PTA when the patient fell down some steps and landed on her right hand. Patient is able to move her fingers, but she notes that two of her fingers have been numb. The pain is worse with movement. 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   Past Medical History  Diagnosis Date  . Asthma   . Other and unspecified ovarian cysts   . Infection     uti  . Abnormal Pap smear     biopsy   Past Surgical History  Procedure Laterality Date  . No past surgeries    . Laparoscopic bilateral salpingectomy Bilateral 05/28/2013    Procedure: LAPAROSCOPIC BILATERAL SALPINGECTOMY;  Surgeon: Allie BossierMyra C Dove, MD;  Location: [facility];  Service: Gynecology;  Laterality: Bilateral;   Family History  Problem Relation Age of Onset  . Diabetes Maternal Grandmother   . Hypertension Paternal Grandmother   . Diabetes Paternal Grandmother    History  Substance Use Topics  . Smoking status: Current Some Day Smoker -- 0.25 packs/day    Types: Cigarettes  . Smokeless tobacco: Never Used  . Alcohol Use: Yes     Comment: occasional wine cooler   OB History     Gravida Para Term Preterm AB TAB SAB Ectopic Multiple Living   3 3 2 1      3      Review of Systems  Musculoskeletal: Positive for arthralgias.  Neurological: Positive for numbness.  All other systems reviewed and are negative.     Allergies  Review of patient's allergies indicates no known allergies.  Home Medications   Prior to Admission medications   Medication Sig Start Date End Date Taking? Authorizing Provider  albuterol (PROVENTIL HFA;VENTOLIN HFA) 108 (90 BASE) MCG/ACT inhaler Inhale into the lungs every 6 (six) hours as needed for wheezing or shortness of breath.    Historical Provider, MD  HYDROcodone-acetaminophen (NORCO/VICODIN) 5-325 MG per tablet Take 1 tablet by mouth every 6 (six) hours as needed for moderate pain. 01/08/14   Arman FilterGail K Schulz, NP  ibuprofen (ADVIL,MOTRIN) 600 MG tablet Take 1 tablet (600 mg total) by mouth every 8 (eight) hours as needed. 01/08/14   Arman FilterGail K Schulz, NP  methocarbamol (ROBAXIN) 750 MG tablet Take 1 tablet (750 mg total) by mouth 3 (three) times daily. 01/08/14   Arman FilterGail K Schulz, NP  permethrin (ELIMITE) 5 % cream Apply to affected area once 02/13/14   Robyn M Hess, PA-C   BP 144/88 mmHg  Pulse 64  Temp(Src) 98.7 F (37.1 C) (Oral)  Resp 18  SpO2 97%  LMP  03/16/2014 Physical Exam  Constitutional: She is oriented to person, place, and time. She appears well-developed and well-nourished. No distress.  HENT:  Head: Normocephalic and atraumatic.  Mouth/Throat: Oropharynx is clear and moist. No oropharyngeal exudate.  Eyes: Pupils are equal, round, and reactive to light.  Neck: Neck supple.  Cardiovascular: Normal rate.   Pulmonary/Chest: Effort normal.  Musculoskeletal: She exhibits tenderness. She exhibits no edema.  Right wrist: generalized wrist TTP without obvious deformity. Full ROM of fingers of right hand. Limited ROM of right wrist due to pain. No snuffbox TTP.  Neurological: She is alert and oriented to person, place, and time. No  cranial nerve deficit.  Skin: Skin is warm and dry. No rash noted.  Psychiatric: She has a normal mood and affect. Her behavior is normal.  Nursing note and vitals reviewed.   ED Course  Procedures  DIAGNOSTIC STUDIES: Oxygen Saturation is 97% on room air, normal by my interpretation.    COORDINATION OF CARE: 3:54 PM-Discussed treatment plan which includes work note and a splint with pt at bedside and pt agreed to plan.    Labs Review Labs Reviewed - No data to display  Imaging Review Dg Wrist Complete Right  04/08/2014   CLINICAL DATA:  Fall down steps today, trying to catch himself using right hand, medial wrist pain, medial forearm pain.  EXAM: RIGHT WRIST - COMPLETE 3+ VIEW  COMPARISON:  None.  FINDINGS: Four views of the right wrist submitted. No acute fracture or subluxation. No radiopaque foreign body.  IMPRESSION: Negative.   Electronically Signed   By: Natasha MeadLiviu  Pop M.D.   On: 04/08/2014 15:36   SPLINT APPLICATION Date/Time: 3:57 PM Authorized by: Emilia BeckKaitlyn Cleofas Hudgins Consent: Verbal consent obtained. Risks and benefits: risks, benefits and alternatives were discussed Consent given by: patient Splint applied by: nurse Location details: right wrist Splint type: thumb spica Supplies used: velcro thumb spica Post-procedure: The splinted body part was neurovascularly unchanged following the procedure. Patient tolerance: Patient tolerated the procedure well with no immediate complications.      EKG Interpretation None      MDM   Final diagnoses:  Right wrist injury, initial encounter    4:06 PM Xray shows no fracture. No neurovascular compromise. No other injury. Patient instructed to rest, ice and elevate. Patient will have Vicodin for pain.   I personally performed the services described in this documentation, which was scribed in my presence. The recorded information has been reviewed and is accurate.    Emilia BeckKaitlyn Ayson Cherubini, PA-C 04/08/14 1606  Linwood DibblesJon Knapp,  MD 04/09/14 [phone]

## 2014-04-08 NOTE — ED Notes (Signed)
Patient states "fell down steps and broke my fall with my R hand".   Patient complains of R wrist pain.    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

## 2014-04-08 NOTE — ED Notes (Signed)
TO XRAY

## 2014-05-06 ENCOUNTER — Emergency Department (HOSPITAL_COMMUNITY)
Admission: EM | Admit: 2014-05-06 | Discharge: 2014-05-06 | Disposition: A | Discharge status: Home / Self Care | Payer: Medicaid Other | Attending: Emergency Medicine | Admitting: Emergency Medicine

## 2014-05-06 ENCOUNTER — Encounter (HOSPITAL_COMMUNITY): Payer: Self-pay | Admitting: Cardiology

## 2014-05-06 DIAGNOSIS — R112 Nausea with vomiting, unspecified: Secondary | ICD-10-CM | POA: Diagnosis not present

## 2014-05-06 DIAGNOSIS — R109 Unspecified abdominal pain: Secondary | ICD-10-CM | POA: Insufficient documentation

## 2014-05-06 DIAGNOSIS — J45909 Unspecified asthma, uncomplicated: Secondary | ICD-10-CM | POA: Insufficient documentation

## 2014-05-06 DIAGNOSIS — Z87440 Personal history of urinary (tract) infections: Secondary | ICD-10-CM | POA: Insufficient documentation

## 2014-05-06 DIAGNOSIS — Z79899 Other long term (current) drug therapy: Secondary | ICD-10-CM | POA: Insufficient documentation

## 2014-05-06 DIAGNOSIS — Z720 Tobacco use: Secondary | ICD-10-CM | POA: Insufficient documentation

## 2014-05-06 DIAGNOSIS — Z8742 Personal history of other diseases of the female genital tract: Secondary | ICD-10-CM | POA: Insufficient documentation

## 2014-05-06 DIAGNOSIS — Z3202 Encounter for pregnancy test, result negative: Secondary | ICD-10-CM | POA: Diagnosis not present

## 2014-05-06 DIAGNOSIS — Z9889 Other specified postprocedural states: Secondary | ICD-10-CM | POA: Diagnosis not present

## 2014-05-06 LAB — COMPREHENSIVE METABOLIC PANEL
ALBUMIN: 3.9 g/dL (ref 3.5–5.2)
ALK PHOS: 57 U/L (ref 39–117)
ALT: 13 U/L (ref 0–35)
AST: 19 U/L (ref 0–37)
Anion gap: 5 (ref 5–15)
BILIRUBIN TOTAL: 0.3 mg/dL (ref 0.3–1.2)
BUN: 5 mg/dL — ABNORMAL LOW (ref 6–23)
CHLORIDE: 106 meq/L (ref 96–112)
CO2: 28 mmol/L (ref 19–32)
Calcium: 9.2 mg/dL (ref 8.4–10.5)
Creatinine, Ser: 0.81 mg/dL (ref 0.50–1.10)
GFR calc Af Amer: >90 mL/min (ref >90)
GFR calc non Af Amer: >90 mL/min (ref >90)
Glucose, Bld: 94 mg/dL (ref 70–99)
POTASSIUM: 4 mmol/L (ref 3.5–5.1)
SODIUM: 139 mmol/L (ref 135–145)
TOTAL PROTEIN: 6.3 g/dL (ref 6.0–8.3)

## 2014-05-06 LAB — URINALYSIS, ROUTINE W REFLEX MICROSCOPIC
BILIRUBIN URINE: NEGATIVE
Glucose, UA: NEGATIVE mg/dL
Ketones, ur: NEGATIVE mg/dL
Nitrite: NEGATIVE
PH: 8 (ref 5.0–8.0)
Protein, ur: NEGATIVE mg/dL
SPECIFIC GRAVITY, URINE: 1.019 (ref 1.005–1.030)
Urobilinogen, UA: 0.2 mg/dL (ref 0.0–1.0)

## 2014-05-06 LAB — CBC WITH DIFFERENTIAL/PLATELET
BASOS PCT: 0 % (ref 0–1)
Basophils Absolute: 0 10*3/uL (ref 0.0–0.1)
EOS ABS: 0.3 10*3/uL (ref 0.0–0.7)
Eosinophils Relative: 5 % (ref 0–5)
HCT: 36.4 % (ref 36.0–46.0)
Hemoglobin: 12.6 g/dL (ref 12.0–15.0)
Lymphocytes Relative: 30 % (ref 12–46)
Lymphs Abs: 1.8 10*3/uL (ref 0.7–4.0)
MCH: 30.4 pg (ref 26.0–34.0)
MCHC: 34.6 g/dL (ref 30.0–36.0)
MCV: 87.7 fL (ref 78.0–100.0)
MONOS PCT: 6 % (ref 3–12)
Monocytes Absolute: 0.3 10*3/uL (ref 0.1–1.0)
NEUTROS ABS: 3.7 10*3/uL (ref 1.7–7.7)
Neutrophils Relative %: 59 % (ref 43–77)
PLATELETS: 168 10*3/uL (ref 150–400)
RBC: 4.15 MIL/uL (ref 3.87–5.11)
RDW: 13.1 % (ref 11.5–15.5)
WBC: 6.2 10*3/uL (ref 4.0–10.5)

## 2014-05-06 LAB — URINE MICROSCOPIC-ADD ON

## 2014-05-06 LAB — POC URINE PREG, ED: Preg Test, Ur: NEGATIVE

## 2014-05-06 MED ORDER — ONDANSETRON 4 MG PO TBDP: 4.0000 mg | ORAL_TABLET | Freq: Three times a day (TID) | ORAL | Status: DC | PRN

## 2014-05-06 MED ORDER — ONDANSETRON 4 MG PO TBDP: 8.0000 mg | ORAL_TABLET | Freq: Once | ORAL | Status: DC

## 2014-05-06 MED ORDER — IBUPROFEN 800 MG PO TABS
800.0000 mg | ORAL_TABLET | Freq: Once | ORAL | Status: AC
  Administered 2014-05-06: 800 mg via ORAL
  Filled 2014-05-06: qty 1

## 2014-05-06 MED ORDER — SODIUM CHLORIDE 0.9 % IV BOLUS (SEPSIS): 1000.0000 mL | Freq: Once | INTRAVENOUS | Status: DC

## 2014-05-06 MED ORDER — ONDANSETRON HCL 4 MG/2ML IJ SOLN: 4.0000 mg | Freq: Once | INTRAMUSCULAR | Status: DC

## 2014-05-06 NOTE — ED Provider Notes (Signed)
CSN: [id number]     Arrival date & time 05/06/14  1813 History   First MD Initiated Contact with Patient 05/06/14 2014     Chief Complaint  Patient presents with  . Emesis  . Abdominal Pain     (Consider location/radiation/quality/duration/timing/severity/associated sxs/prior Treatment) Patient is a 28 y.o. female presenting with vomiting and abdominal pain. The history is provided by the patient and medical records.  Emesis Associated symptoms: abdominal pain   Abdominal Pain Associated symptoms: nausea and vomiting     This is a 28 year old female with past medical history significant for asthma, presenting to the ED for nausea, vomiting, abdominal pain for the past 2 days. 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 States today once beginning work she began vomiting uncontrollably, but states she feels much better now. She denies any fever but states she has broke out in cold sweats after vomiting. She denies any dietary changes or recent travel.  She has been unable to tolerate PO solids or liquids today thus far.  States earlier today she felt as if her stomach was "tied in knots" and "churning" but this is much better now as well.  Denies urinary symptoms or vaginal complaints.  Patient is s/p tubal ligation.  Past Medical History  Diagnosis Date  . Asthma   . Other and unspecified ovarian cysts   . Infection     uti  . Abnormal Pap smear     biopsy   Past Surgical History  Procedure Laterality Date  . No past surgeries    . Laparoscopic bilateral salpingectomy Bilateral 05/28/2013    Procedure: LAPAROSCOPIC BILATERAL SALPINGECTOMY;  Surgeon: Allie BossierMyra C Dove, MD;  Location: [facility];  Service: Gynecology;  Laterality: Bilateral;   Family History  Problem Relation Age of Onset  . Diabetes Maternal Grandmother   . Hypertension Paternal Grandmother   . Diabetes Paternal Grandmother    History  Substance Use  Topics  . Smoking status: Current Some Day Smoker -- 0.25 packs/day    Types: Cigarettes  . Smokeless tobacco: Never Used  . Alcohol Use: Yes     Comment: occasional wine cooler   OB History    Gravida Para Term Preterm AB TAB SAB Ectopic Multiple Living   3 3 2 1      3      Review of Systems  Gastrointestinal: Positive for nausea, vomiting and abdominal pain.  All other systems reviewed and are negative.     Allergies  Review of patient's allergies indicates no known allergies.  Home Medications   Prior to Admission medications   Medication Sig Start Date End Date Taking? Authorizing Provider  albuterol (PROVENTIL HFA;VENTOLIN HFA) 108 (90 BASE) MCG/ACT inhaler Inhale into the lungs every 6 (six) hours as needed for wheezing or shortness of breath.    Historical Provider, MD  HYDROcodone-acetaminophen (NORCO/VICODIN) 5-325 MG per tablet Take 1 tablet by mouth every 6 (six) hours as needed for moderate pain. 01/08/14   Arman FilterGail K Schulz, NP  HYDROcodone-acetaminophen (NORCO/VICODIN) 5-325 MG per tablet Take 2 tablets by mouth every 4 (four) hours as needed for moderate pain or severe pain. 04/08/14   Kaitlyn Szekalski, PA-C  ibuprofen (ADVIL,MOTRIN) 600 MG tablet Take 1 tablet (600 mg total) by mouth every 8 (eight) hours as needed. 01/08/14   Arman FilterGail K Schulz, NP  methocarbamol (ROBAXIN) 750 MG tablet Take 1 tablet (750 mg total) by mouth 3 (three) times  daily. 01/08/14   Arman FilterGail K Schulz, NP  permethrin (ELIMITE) 5 % cream Apply to affected area once 02/13/14   Robyn M Hess, PA-C   BP 132/84 mmHg  Pulse 56  Temp(Src) 97.9 F (36.6 C) (Oral)  Resp 18  Ht 5\' 8"  (1.727 m)  Wt 185 lb (83.915 kg)  BMI 28.14 kg/m2  SpO2 98%  LMP 04/21/2014   Physical Exam  Constitutional: She is oriented to person, place, and time. She appears well-developed and well-nourished. No distress.  HENT:  Head: Normocephalic and atraumatic.  Mouth/Throat: Oropharynx is clear and moist.  Mildly dry mucous  membranes  Eyes: Conjunctivae and EOM are normal. Pupils are equal, round, and reactive to light.  Neck: Normal range of motion. Neck supple.  Cardiovascular: Normal rate, regular rhythm and normal heart sounds.   Pulmonary/Chest: Effort normal and breath sounds normal. No respiratory distress. She has no wheezes.  Abdominal: Soft. Bowel sounds are normal. There is no tenderness. There is no guarding.  Abdomen soft, nondistended, no focal tenderness or peritoneal signs  Musculoskeletal: Normal range of motion.  Neurological: She is alert and oriented to person, place, and time.  Skin: Skin is warm and dry. She is not diaphoretic.  Psychiatric: She has a normal mood and affect.  Nursing note and vitals reviewed.   ED Course  Procedures (including critical care time) Labs Review Labs Reviewed  COMPREHENSIVE METABOLIC PANEL - Abnormal; Notable for the following:    BUN 5 (*)    All other components within normal limits  URINALYSIS, ROUTINE W REFLEX MICROSCOPIC - Abnormal; Notable for the following:    APPearance CLOUDY (*)    Hgb urine dipstick TRACE (*)    Leukocytes, UA LARGE (*)    All other components within normal limits  URINE MICROSCOPIC-ADD ON - Abnormal; Notable for the following:    Squamous Epithelial / LPF MANY (*)    Bacteria, UA FEW (*)    All other components within normal limits  CBC WITH DIFFERENTIAL  POC URINE PREG, ED    Imaging Review No results found.   EKG Interpretation None      MDM   Final diagnoses:  Abdominal pain, unspecified abdominal location  Nausea and vomiting, vomiting of unspecified type   28 year old female with likely viral gastroenteritis, known sick contacts with similar symptoms. On exam, patient afebrile and nontoxic in appearance. Her abdominal exam is benign. Her mucous membranes are mildly dry. Lab work obtained in triage which is reassuring. I have offered patient's IV fluids and IV antibiotics, however she prefers oral  medications. Will give ODT Zofran and attempt PO challenge.  Urine preg and u/a pending.  Urine preg negative.  U/a appears contaminated, patient without current urinary symptoms.  Patient has tolerated PO without difficulty, states she is feeling better.  She remains afebrile and non-toxic with benign abdominal exam.  Low suspicion for acute/surgical abdomen at this time, more likely viral gastroenteritis.  Feel patient safe for discharge with OP management.  Rx zofran, encouraged fluids, gentle diet and progress back to normal as tolerated.  FU with PCP.  Discussed plan with patient, he/she acknowledged understanding and agreed with plan of care.  Return precautions given for new or worsening symptoms.  Garlon HatchetLisa M Durelle Zepeda, PA-C 05/06/14 2332  Gilda Creasehristopher J. Pollina, MD 05/06/14 [phone]

## 2014-05-06 NOTE — Discharge Instructions (Signed)
Take the prescribed medication as directed for nausea.  Recommend bland diet/BRAT diet for the next 24-48 hours and progress back to normal as tolerated. Return to the ED for new or worsening symptoms.

## 2014-05-06 NOTE — ED Notes (Signed)
Pt refused gown.  But wants to stay.

## 2014-05-06 NOTE — ED Notes (Signed)
Pt reports that she has been vomiting and had abd pain for the past couple of days. 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

## 2014-05-07 ENCOUNTER — Encounter (HOSPITAL_BASED_OUTPATIENT_CLINIC_OR_DEPARTMENT_OTHER): Payer: Self-pay | Admitting: Emergency Medicine

## 2014-05-07 ENCOUNTER — Telehealth (HOSPITAL_BASED_OUTPATIENT_CLINIC_OR_DEPARTMENT_OTHER): Payer: Self-pay | Admitting: Emergency Medicine

## 2014-05-16 ENCOUNTER — Encounter (HOSPITAL_COMMUNITY): Payer: Self-pay | Admitting: Emergency Medicine

## 2014-05-16 DIAGNOSIS — Z79899 Other long term (current) drug therapy: Secondary | ICD-10-CM | POA: Diagnosis not present

## 2014-05-16 DIAGNOSIS — J45909 Unspecified asthma, uncomplicated: Secondary | ICD-10-CM | POA: Diagnosis not present

## 2014-05-16 DIAGNOSIS — S0990XA Unspecified injury of head, initial encounter: Secondary | ICD-10-CM | POA: Insufficient documentation

## 2014-05-16 DIAGNOSIS — E860 Dehydration: Secondary | ICD-10-CM | POA: Diagnosis not present

## 2014-05-16 DIAGNOSIS — Y998 Other external cause status: Secondary | ICD-10-CM | POA: Diagnosis not present

## 2014-05-16 DIAGNOSIS — Z8742 Personal history of other diseases of the female genital tract: Secondary | ICD-10-CM | POA: Diagnosis not present

## 2014-05-16 DIAGNOSIS — Z3202 Encounter for pregnancy test, result negative: Secondary | ICD-10-CM | POA: Diagnosis not present

## 2014-05-16 DIAGNOSIS — Y9389 Activity, other specified: Secondary | ICD-10-CM | POA: Diagnosis not present

## 2014-05-16 DIAGNOSIS — W01198A Fall on same level from slipping, tripping and stumbling with subsequent striking against other object, initial encounter: Secondary | ICD-10-CM | POA: Insufficient documentation

## 2014-05-16 DIAGNOSIS — Z720 Tobacco use: Secondary | ICD-10-CM | POA: Diagnosis not present

## 2014-05-16 DIAGNOSIS — Y92031 Bathroom in apartment as the place of occurrence of the external cause: Secondary | ICD-10-CM | POA: Diagnosis not present

## 2014-05-16 NOTE — ED Notes (Signed)
Pt. slipped and fell in bathroom at home this evening , no LOC / ambulatory , alert and oriented , reports occipital headache.

## 2014-05-17 ENCOUNTER — Emergency Department (HOSPITAL_COMMUNITY)
Admission: EM | Admit: 2014-05-17 | Discharge: 2014-05-17 | Disposition: A | Discharge status: Home / Self Care | Payer: Medicaid Other | Attending: Emergency Medicine | Admitting: Emergency Medicine

## 2014-05-17 DIAGNOSIS — W19XXXA Unspecified fall, initial encounter: Secondary | ICD-10-CM

## 2014-05-17 DIAGNOSIS — E860 Dehydration: Secondary | ICD-10-CM

## 2014-05-17 DIAGNOSIS — Y92009 Unspecified place in unspecified non-institutional (private) residence as the place of occurrence of the external cause: Secondary | ICD-10-CM

## 2014-05-17 DIAGNOSIS — E639 Nutritional deficiency, unspecified: Secondary | ICD-10-CM

## 2014-05-17 LAB — BASIC METABOLIC PANEL
Anion gap: 10 (ref 5–15)
BUN: 7 mg/dL (ref 6–23)
CHLORIDE: 103 meq/L (ref 96–112)
CO2: 27 mmol/L (ref 19–32)
Calcium: 9.7 mg/dL (ref 8.4–10.5)
Creatinine, Ser: 0.82 mg/dL (ref 0.50–1.10)
GFR calc Af Amer: >90 mL/min (ref >90)
GLUCOSE: 80 mg/dL (ref 70–99)
Potassium: 4.3 mmol/L (ref 3.5–5.1)
Sodium: 140 mmol/L (ref 135–145)

## 2014-05-17 LAB — CBC WITH DIFFERENTIAL/PLATELET
BASOS ABS: 0 10*3/uL (ref 0.0–0.1)
Basophils Relative: 0 % (ref 0–1)
EOS PCT: 1 % (ref 0–5)
Eosinophils Absolute: 0.1 10*3/uL (ref 0.0–0.7)
HCT: 41.7 % (ref 36.0–46.0)
Hemoglobin: 13.9 g/dL (ref 12.0–15.0)
LYMPHS ABS: 2.3 10*3/uL (ref 0.7–4.0)
Lymphocytes Relative: 22 % (ref 12–46)
MCH: 30.5 pg (ref 26.0–34.0)
MCHC: 33.3 g/dL (ref 30.0–36.0)
MCV: 91.6 fL (ref 78.0–100.0)
MONO ABS: 0.9 10*3/uL (ref 0.1–1.0)
MONOS PCT: 8 % (ref 3–12)
Neutro Abs: 7.4 10*3/uL (ref 1.7–7.7)
Neutrophils Relative %: 69 % (ref 43–77)
Platelets: 209 10*3/uL (ref 150–400)
RBC: 4.55 MIL/uL (ref 3.87–5.11)
RDW: 13.7 % (ref 11.5–15.5)
WBC: 10.6 10*3/uL — AB (ref 4.0–10.5)

## 2014-05-17 LAB — URINALYSIS, ROUTINE W REFLEX MICROSCOPIC
Bilirubin Urine: NEGATIVE
Glucose, UA: NEGATIVE mg/dL
KETONES UR: 40 mg/dL — AB
NITRITE: NEGATIVE
Specific Gravity, Urine: 1.025 (ref 1.005–1.030)
Urobilinogen, UA: 0.2 mg/dL (ref 0.0–1.0)
pH: 6.5 (ref 5.0–8.0)

## 2014-05-17 LAB — PREGNANCY, URINE: Preg Test, Ur: NEGATIVE

## 2014-05-17 LAB — URINE MICROSCOPIC-ADD ON

## 2014-05-17 NOTE — ED Provider Notes (Signed)
CSN: [id number]     Arrival date & time 05/16/14  2344 History  This chart was scribed for Olivia Mackie, MD by Gwenyth Ober, ED Scribe. This patient was seen in room A04C/A04C and the patient's care was started at 1:08 AM.    Chief Complaint  Patient presents with  . Fall   The history is provided by the patient. No language interpreter was used.    HPI Comments: Meghan Salazar is a 29 y.o. female who presents to the Emergency Department complaining of a fall that occurred 5-6 hours ago while she was in the bathroom. Pt states a headache, bump on the back of her head and light-headedness as associated symptoms. She also notes "feeling off" as if she is moving while sitting still. Pt does not know if she slipped or passed out. She only remembers waking up on the floor. Pt does not think she has eaten in the last 2 days because she has been busy. She reports that she has 3 children and works 2nd shift. Pt took Tylenol PTA with no relief to symptoms.   Past Medical History  Diagnosis Date  . Asthma   . Other and unspecified ovarian cysts   . Infection     uti  . Abnormal Pap smear     biopsy   Past Surgical History  Procedure Laterality Date  . No past surgeries    . Laparoscopic bilateral salpingectomy Bilateral 05/28/2013    Procedure: LAPAROSCOPIC BILATERAL SALPINGECTOMY;  Surgeon: Allie Bossier, MD;  Location: [facility];  Service: Gynecology;  Laterality: Bilateral;   Family History  Problem Relation Age of Onset  . Diabetes Maternal Grandmother   . Hypertension Paternal Grandmother   . Diabetes Paternal Grandmother    History  Substance Use Topics  . Smoking status: Current Some Day Smoker -- 0.25 packs/day    Types: Cigarettes  . Smokeless tobacco: Never Used  . Alcohol Use: Yes     Comment: occasional wine cooler   OB History    Gravida Para Term Preterm AB TAB SAB Ectopic Multiple Living   Review of Systems  Skin: Negative for wound.  Neurological:  Positive for light-headedness and headaches.  All other systems reviewed and are negative.     Allergies  Review of patient's allergies indicates no known allergies.  Home Medications   Prior to Admission medications   Medication Sig Start Date End Date Taking? Authorizing Provider  albuterol (PROVENTIL HFA;VENTOLIN HFA) 108 (90 BASE) MCG/ACT inhaler Inhale into the lungs every 6 (six) hours as needed for wheezing or shortness of breath.    Historical Provider, MD  Multiple Vitamins-Minerals (MULTIVITAMIN WITH MINERALS) tablet Take 1 tablet by mouth daily.    Historical Provider, MD  ondansetron (ZOFRAN ODT) 4 MG disintegrating tablet Take 1 tablet (4 mg total) by mouth every 8 (eight) hours as needed for nausea. 05/06/14   Garlon Hatchet, PA-C   BP 128/83 mmHg  Pulse 71  Temp(Src) 98.1 F (36.7 C) (Oral)  Resp 16  SpO2 100%  LMP 04/21/2014 Physical Exam  Constitutional: She is oriented to person, place, and time. She appears well-developed and well-nourished.  HENT:  Head: Normocephalic and atraumatic.  Right Ear: External ear normal.  Left Ear: External ear normal.  Nose: Nose normal.  Mouth/Throat: Oropharynx is clear and moist.  Eyes: Conjunctivae and EOM are normal. Pupils are equal, round, and reactive  to light.  Neck: Normal range of motion. Neck supple. No JVD present. No tracheal deviation present. No thyromegaly present.  Cardiovascular: Normal rate, regular rhythm, normal heart sounds and intact distal pulses.  Exam reveals no gallop and no friction rub.   No murmur heard. Pulmonary/Chest: Effort normal and breath sounds normal. No stridor. No respiratory distress. She has no wheezes. She has no rales. She exhibits no tenderness.  Abdominal: Soft. Bowel sounds are normal. She exhibits no distension and no mass. There is no tenderness. There is no rebound and no guarding.  Musculoskeletal: Normal range of motion. She exhibits no edema or tenderness.  Lymphadenopathy:     She has no cervical adenopathy.  Neurological: She is alert and oriented to person, place, and time. She displays normal reflexes. No cranial nerve deficit. She exhibits normal muscle tone. Coordination normal.  Skin: Skin is warm and dry. No rash noted. No erythema. No pallor.  Psychiatric: She has a normal mood and affect. Her behavior is normal. Judgment and thought content normal.  Nursing note and vitals reviewed.   ED Course  Procedures (including critical care time) DIAGNOSTIC STUDIES: Oxygen Saturation is 100% on RA, normal by my interpretation.    COORDINATION OF CARE: 1:14 AM Discussed treatment plan with pt which includes lab work and pt agreed to plan.    Labs Review Labs Reviewed - No data to display  Imaging Review No results found.   EKG Interpretation None     Results for orders placed or performed during the hospital encounter of 05/17/14  Urinalysis, Routine w reflex microscopic  Result Value Ref Range   Color, Urine YELLOW YELLOW   APPearance CLEAR CLEAR   Specific Gravity, Urine 1.025 1.005 - 1.030   pH 6.5 5.0 - 8.0   Glucose, UA NEGATIVE NEGATIVE mg/dL   Hgb urine dipstick SMALL (A) NEGATIVE   Bilirubin Urine NEGATIVE NEGATIVE   Ketones, ur 40 (A) NEGATIVE mg/dL   Protein, ur TRACE (A) NEGATIVE mg/dL   Urobilinogen, UA 0.2 0.0 - 1.0 mg/dL   Nitrite NEGATIVE NEGATIVE   Leukocytes, UA SMALL (A) NEGATIVE  Pregnancy, urine  Result Value Ref Range   Preg Test, Ur NEGATIVE NEGATIVE  Basic metabolic panel  Result Value Ref Range   Sodium 140 135 - 145 mmol/L   Potassium 4.3 3.5 - 5.1 mmol/L   Chloride 103 96 - 112 mEq/L   CO2 27 19 - 32 mmol/L   Glucose, Bld 80 70 - 99 mg/dL   BUN 7 6 - 23 mg/dL   Creatinine, Ser 1.61 0.50 - 1.10 mg/dL   Calcium 9.7 8.4 - 09.6 mg/dL   GFR calc non Af Amer >90 >90 mL/min   GFR calc Af Amer >90 >90 mL/min   Anion gap 10 5 - 15  CBC with Differential  Result Value Ref Range   WBC 10.6 (H) 4.0 - 10.5 K/uL    RBC 4.55 3.87 - 5.11 MIL/uL   Hemoglobin 13.9 12.0 - 15.0 g/dL   HCT 04.5 40.9 - 81.1 %   MCV 91.6 78.0 - 100.0 fL   MCH 30.5 26.0 - 34.0 pg   MCHC 33.3 30.0 - 36.0 g/dL   RDW 91.4 78.2 - 95.6 %   Platelets 209 150 - 400 K/uL   Neutrophils Relative % 69 43 - 77 %   Neutro Abs 7.4 1.7 - 7.7 K/uL   Lymphocytes Relative 22 12 - 46 %   Lymphs Abs 2.3 0.7 - 4.0 K/uL  Monocytes Relative 8 3 - 12 %   Monocytes Absolute 0.9 0.1 - 1.0 K/uL   Eosinophils Relative 1 0 - 5 %   Eosinophils Absolute 0.1 0.0 - 0.7 K/uL   Basophils Relative 0 0 - 1 %   Basophils Absolute 0.0 0.0 - 0.1 K/uL  Urine microscopic-add on  Result Value Ref Range   Squamous Epithelial / LPF RARE RARE   WBC, UA 7-10 <3 WBC/hpf   RBC / HPF 3-6 <3 RBC/hpf   Bacteria, UA FEW (A) RARE   Urine-Other MUCOUS PRESENT    No results found.   MDM   Final diagnoses:  Fall at home, initial encounter  Dehydration  Poor nutrition    I personally performed the services described in this documentation, which was scribed in my presence. The recorded information has been reviewed and is accurate.    29 year old female with fall tonight.  We'll syncope.  Patient reports that she has forgotten the EEG for last 2 days.  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  She reports that she feels tired and washed out.  She was concerned as she struck her head during the fall that she should not go to sleep without being evaluated.  No nausea no blurred vision, no vomiting.  Patient with pain to her occiput.  No other complaints at this time   Olivia Mackie, MD 05/17/14 [phone]

## 2014-05-17 NOTE — ED Notes (Signed)
Pt reports she fell and hit the back of her head around 8-9 pm yesterday. Pt states she may have hit her head on the sink or toilet, unsure of what caused her fall. Pt reports she has a headache and pain to back of her head she tried tylenol with no relief. Pt states she hasn't been eating well. Pt alert and oriented was ambulatory to room. Pt states she is scared to go to sleep due to her dizziness and the fall. Pt rates pain 6/10. Pt states she feels off, like she is moving while she is sitting still. Pt has a lot of stress, works 2 jobs.

## 2014-05-17 NOTE — Discharge Instructions (Signed)
It is very important for you to take time for yourself so that you can stay healthy and be a good mom to your kids.  Eat small frequent meals.  Drink plenty of water during the day.  Rest when possible.   Dehydration, Adult Dehydration means your body does not have as much fluid as it needs. Your kidneys, brain, and heart will not work properly without the right amount of fluids and salt.  HOME CARE  Ask your doctor how to replace body fluid losses (rehydrate).  Drink enough fluids to keep your pee (urine) clear or pale yellow.  Drink small amounts of fluids often if you feel sick to your stomach (nauseous) or throw up (vomit).  Eat like you normally do.  Avoid:  Foods or drinks high in sugar.  Bubbly (carbonated) drinks.  Juice.  Very hot or cold fluids.  Drinks with caffeine.  Fatty, greasy foods.  Alcohol.  Tobacco.  Eating too much.  Gelatin desserts.  Wash your hands to avoid spreading germs (bacteria, viruses).  Only take medicine as told by your doctor.  Keep all doctor visits as told. GET HELP RIGHT AWAY IF:   You cannot drink something without throwing up.  You get worse even with treatment.  Your vomit has blood in it or looks greenish.  Your poop (stool) has blood in it or looks black and tarry.  You have not peed in 6 to 8 hours.  You pee a small amount of very dark pee.  You have a fever.  You pass out (faint).  You have belly (abdominal) pain that gets worse or stays in one spot (localizes).  You have a rash, stiff neck, or bad headache.  You get easily annoyed, sleepy, or are hard to wake up.  You feel weak, dizzy, or very thirsty. MAKE SURE YOU:   Understand these instructions.  Will watch your condition.  Will get help right away if you are not doing well or get worse. Document Released: 02/26/2009 Document Revised: 07/25/2011 Document Reviewed: 12/20/2010 [facility] Patient Information 2015 [place], [place]. This information is  not intended to replace advice given to you by your health care provider. Make sure you discuss any questions you have with your health care provider.  Fall Prevention and Home Safety Falls cause injuries and can affect all age groups. It is possible to prevent falls.  HOW TO PREVENT FALLS  Wear shoes with rubber soles that do not have an opening for your toes.  Keep the inside and outside of your house well lit.  Use night lights throughout your home.  Remove clutter from floors.  Clean up floor spills.  Remove throw rugs or fasten them to the floor with carpet tape.  Do not place electrical cords across pathways.  Put grab bars by your tub, shower, and toilet. Do not use towel bars as grab bars.  Put handrails on both sides of the stairway. Fix loose handrails.  Do not climb on stools or stepladders, if possible.  Do not wax your floors.  Repair uneven or unsafe sidewalks, walkways, or stairs.  Keep items you use a lot within reach.  Be aware of pets.  Keep emergency numbers next to the telephone.  Put smoke detectors in your home and near bedrooms. Ask your doctor what other things you can do to prevent falls. Document Released: 02/26/2009 Document Revised: 11/01/2011 Document Reviewed: 08/02/2011 [facility] Patient Information 2015 [place], [place]. This information is not intended to replace advice given to  you by your health care provider. Make sure you discuss any questions you have with your health care provider.  Serving Sizes What we call a serving size today is larger than it was in the past. A 1950s fast-food burger contained little more than 1 oz of meat, and a soft drink was 8 oz (1 cup). Today, a "quarter pounder" burger is at least 4 times that amount, and a 32 or 64 oz drink is not uncommon. A possible guide for eating when trying to lose weight is to eat about half as much as you normally do. Some estimates of serving sizes are:  1 Dairy serving:Individual  container of yogurt (8 oz) or piece of cheese the size of your thumb (1 oz).  1 Grain serving: 1 slice of bread or  cup pasta.  1 Meat serving: The size of a deck of cards (3 oz).  1 Fruit serving: cup canned fruit or 1 medium fruit.  1 Vegetable serving:  cup of cooked or canned vegetables.  1 Fat serving:The size of 4 stacked dimes. Experts suggest spending 1 or 2 days measuring food portions you commonly eat. This will give you better practice at estimating serving sizes, and will also show whether you are eating an appropriate amount of food to meet your weight goals. If you find that you are eating more than you thought, try measuring your food for a few days so you can "reprogram" yourself to learn what makes a healthy portion for you. SUGGESTIONS FOR CONTROL  In restaurants, share entrees, or ask the waiter to put half the entre in a box or bag before you even touch it.  Order lunch-sized portions. Many restaurants serve 4 to 6 oz of meat at lunch, compared with 8 to 10 oz at dinner.  Split dessert or skip it all together. Have a piece of fruit when you get home.  At home, use smaller plates and bowls. It will look as if you are eating more.  Plate your food in the kitchen rather than serving it "family style" at the table.  Wait 20 to 30 minutes before taking seconds. This is how long it takes your brain to recognize that you are full.  Check food labels for serving sizes. Eat 1 serving only.  Use measuring cups and spoons to see proper serving sizes.  Buy smaller packages of candy, popcorn, and snacks.  Avoid eating directly out of the bag or carton.  While eating half as much, exercise twice as much. Park further away from the mall, take the stairs instead of the escalator, and walk around your block. Losing weight is a slow, difficult process. It takes long-lasting lifestyle changes. You can make gradual changes over time so they become habits. Look to friends and  family to support the healthy changes you are making. Avoid fad diets since they are often only temporary weight loss solutions. Document Released: 01/29/2003 Document Revised: 07/25/2011 Document Reviewed: 07/30/2013 [facility] Patient Information 2015 [place], [place]. This information is not intended to replace advice given to you by your health care provider. Make sure you discuss any questions you have with your health care provider.

## 2014-05-18 LAB — URINE CULTURE: Colony Count: 40000

## 2015-02-16 ENCOUNTER — Emergency Department (HOSPITAL_COMMUNITY)
Admission: EM | Admit: 2015-02-16 | Discharge: 2015-02-16 | Disposition: A | Discharge status: Home / Self Care | Payer: Medicaid Other | Attending: Emergency Medicine | Admitting: Emergency Medicine

## 2015-02-16 ENCOUNTER — Encounter (HOSPITAL_COMMUNITY): Payer: Self-pay | Admitting: *Deleted

## 2015-02-16 ENCOUNTER — Emergency Department (HOSPITAL_COMMUNITY): Payer: Medicaid Other

## 2015-02-16 DIAGNOSIS — S8992XA Unspecified injury of left lower leg, initial encounter: Secondary | ICD-10-CM | POA: Diagnosis not present

## 2015-02-16 DIAGNOSIS — J45909 Unspecified asthma, uncomplicated: Secondary | ICD-10-CM | POA: Insufficient documentation

## 2015-02-16 DIAGNOSIS — Z720 Tobacco use: Secondary | ICD-10-CM | POA: Insufficient documentation

## 2015-02-16 DIAGNOSIS — F419 Anxiety disorder, unspecified: Secondary | ICD-10-CM | POA: Insufficient documentation

## 2015-02-16 DIAGNOSIS — Y998 Other external cause status: Secondary | ICD-10-CM | POA: Insufficient documentation

## 2015-02-16 DIAGNOSIS — S8991XA Unspecified injury of right lower leg, initial encounter: Secondary | ICD-10-CM | POA: Insufficient documentation

## 2015-02-16 DIAGNOSIS — Y92410 Unspecified street and highway as the place of occurrence of the external cause: Secondary | ICD-10-CM | POA: Insufficient documentation

## 2015-02-16 DIAGNOSIS — Z87440 Personal history of urinary (tract) infections: Secondary | ICD-10-CM | POA: Insufficient documentation

## 2015-02-16 DIAGNOSIS — S6991XA Unspecified injury of right wrist, hand and finger(s), initial encounter: Secondary | ICD-10-CM | POA: Insufficient documentation

## 2015-02-16 DIAGNOSIS — S6992XA Unspecified injury of left wrist, hand and finger(s), initial encounter: Secondary | ICD-10-CM | POA: Diagnosis not present

## 2015-02-16 DIAGNOSIS — S29001A Unspecified injury of muscle and tendon of front wall of thorax, initial encounter: Secondary | ICD-10-CM | POA: Insufficient documentation

## 2015-02-16 DIAGNOSIS — S199XXA Unspecified injury of neck, initial encounter: Secondary | ICD-10-CM | POA: Insufficient documentation

## 2015-02-16 DIAGNOSIS — Z79899 Other long term (current) drug therapy: Secondary | ICD-10-CM | POA: Insufficient documentation

## 2015-02-16 DIAGNOSIS — Y9389 Activity, other specified: Secondary | ICD-10-CM | POA: Diagnosis not present

## 2015-02-16 DIAGNOSIS — M7918 Myalgia, other site: Secondary | ICD-10-CM

## 2015-02-16 MED ORDER — DIAZEPAM 5 MG PO TABS
5.0000 mg | ORAL_TABLET | Freq: Once | ORAL | Status: AC
  Administered 2015-02-16: 5 mg via ORAL
  Filled 2015-02-16: qty 1

## 2015-02-16 MED ORDER — KETOROLAC TROMETHAMINE 60 MG/2ML IM SOLN
60.0000 mg | Freq: Once | INTRAMUSCULAR | Status: AC
  Administered 2015-02-16: 60 mg via INTRAMUSCULAR
  Filled 2015-02-16: qty 2

## 2015-02-16 MED ORDER — HYDROCODONE-ACETAMINOPHEN 5-325 MG PO TABS
1.0000 | ORAL_TABLET | Freq: Once | ORAL | Status: AC
  Administered 2015-02-16: 1 via ORAL
  Filled 2015-02-16: qty 1

## 2015-02-16 MED ORDER — METHOCARBAMOL 500 MG PO TABS: 500.0000 mg | ORAL_TABLET | Freq: Two times a day (BID) | ORAL | Status: DC

## 2015-02-16 MED ORDER — NAPROXEN 500 MG PO TABS: 500.0000 mg | ORAL_TABLET | Freq: Two times a day (BID) | ORAL | Status: DC

## 2015-02-16 MED ORDER — HYDROCODONE-ACETAMINOPHEN 5-325 MG PO TABS: 2.0000 | ORAL_TABLET | ORAL | Status: DC | PRN
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
## 2015-02-16 NOTE — ED Notes (Signed)
PT talking on telephone crying loudly.
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
## 2015-05-13 ENCOUNTER — Encounter (HOSPITAL_COMMUNITY): Payer: Self-pay | Admitting: Emergency Medicine

## 2015-05-13 ENCOUNTER — Emergency Department (HOSPITAL_COMMUNITY)
Admission: EM | Admit: 2015-05-13 | Discharge: 2015-05-13 | Disposition: A | Discharge status: Home / Self Care | Payer: Medicaid Other | Attending: Emergency Medicine | Admitting: Emergency Medicine

## 2015-05-13 ENCOUNTER — Emergency Department (HOSPITAL_COMMUNITY): Payer: Medicaid Other

## 2015-05-13 DIAGNOSIS — S59911A Unspecified injury of right forearm, initial encounter: Secondary | ICD-10-CM | POA: Insufficient documentation

## 2015-05-13 DIAGNOSIS — Y9289 Other specified places as the place of occurrence of the external cause: Secondary | ICD-10-CM | POA: Diagnosis not present

## 2015-05-13 DIAGNOSIS — Z8742 Personal history of other diseases of the female genital tract: Secondary | ICD-10-CM | POA: Insufficient documentation

## 2015-05-13 DIAGNOSIS — S199XXA Unspecified injury of neck, initial encounter: Secondary | ICD-10-CM | POA: Insufficient documentation

## 2015-05-13 DIAGNOSIS — T07XXXA Unspecified multiple injuries, initial encounter: Secondary | ICD-10-CM

## 2015-05-13 DIAGNOSIS — S299XXA Unspecified injury of thorax, initial encounter: Secondary | ICD-10-CM | POA: Insufficient documentation

## 2015-05-13 DIAGNOSIS — Y9389 Activity, other specified: Secondary | ICD-10-CM | POA: Insufficient documentation

## 2015-05-13 DIAGNOSIS — S6991XA Unspecified injury of right wrist, hand and finger(s), initial encounter: Secondary | ICD-10-CM | POA: Insufficient documentation

## 2015-05-13 DIAGNOSIS — Z79899 Other long term (current) drug therapy: Secondary | ICD-10-CM | POA: Insufficient documentation

## 2015-05-13 DIAGNOSIS — S022XXA Fracture of nasal bones, initial encounter for closed fracture: Secondary | ICD-10-CM | POA: Insufficient documentation

## 2015-05-13 DIAGNOSIS — S6992XA Unspecified injury of left wrist, hand and finger(s), initial encounter: Secondary | ICD-10-CM | POA: Diagnosis not present

## 2015-05-13 DIAGNOSIS — S59912A Unspecified injury of left forearm, initial encounter: Secondary | ICD-10-CM | POA: Diagnosis not present

## 2015-05-13 DIAGNOSIS — S8992XA Unspecified injury of left lower leg, initial encounter: Secondary | ICD-10-CM | POA: Diagnosis not present

## 2015-05-13 DIAGNOSIS — Z87891 Personal history of nicotine dependence: Secondary | ICD-10-CM | POA: Diagnosis not present

## 2015-05-13 DIAGNOSIS — J45909 Unspecified asthma, uncomplicated: Secondary | ICD-10-CM | POA: Insufficient documentation

## 2015-05-13 DIAGNOSIS — Y998 Other external cause status: Secondary | ICD-10-CM | POA: Diagnosis not present

## 2015-05-13 DIAGNOSIS — J3489 Other specified disorders of nose and nasal sinuses: Secondary | ICD-10-CM | POA: Diagnosis present

## 2015-05-13 MED ORDER — HYDROCODONE-ACETAMINOPHEN 5-325 MG PO TABS
1.0000 | ORAL_TABLET | ORAL | Status: AC
  Administered 2015-05-13: 1 via ORAL
  Filled 2015-05-13: qty 1

## 2015-05-13 MED ORDER — NAPROXEN 500 MG PO TABS: 500.0000 mg | ORAL_TABLET | Freq: Two times a day (BID) | ORAL | Status: DC

## 2015-05-13 MED ORDER — IBUPROFEN 400 MG PO TABS
600.0000 mg | ORAL_TABLET | Freq: Once | ORAL | Status: AC
  Administered 2015-05-13: 600 mg via ORAL
  Filled 2015-05-13: qty 2

## 2015-05-13 MED ORDER — HYDROCODONE-ACETAMINOPHEN 5-325 MG PO TABS: 1.0000 | ORAL_TABLET | ORAL | Status: DC | PRN
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
## 2015-09-20 ENCOUNTER — Encounter (HOSPITAL_COMMUNITY): Payer: Self-pay | Admitting: *Deleted

## 2015-09-20 ENCOUNTER — Emergency Department (HOSPITAL_COMMUNITY)
Admission: EM | Admit: 2015-09-20 | Discharge: 2015-09-20 | Disposition: A | Discharge status: Home / Self Care | Payer: Self-pay | Attending: Emergency Medicine | Admitting: Emergency Medicine

## 2015-09-20 ENCOUNTER — Emergency Department (HOSPITAL_COMMUNITY): Payer: Self-pay

## 2015-09-20 DIAGNOSIS — J45909 Unspecified asthma, uncomplicated: Secondary | ICD-10-CM | POA: Insufficient documentation

## 2015-09-20 DIAGNOSIS — Z8742 Personal history of other diseases of the female genital tract: Secondary | ICD-10-CM | POA: Insufficient documentation

## 2015-09-20 DIAGNOSIS — R202 Paresthesia of skin: Secondary | ICD-10-CM | POA: Insufficient documentation

## 2015-09-20 DIAGNOSIS — Z791 Long term (current) use of non-steroidal anti-inflammatories (NSAID): Secondary | ICD-10-CM | POA: Insufficient documentation

## 2015-09-20 DIAGNOSIS — M20012 Mallet finger of left finger(s): Secondary | ICD-10-CM | POA: Insufficient documentation

## 2015-09-20 DIAGNOSIS — Z87891 Personal history of nicotine dependence: Secondary | ICD-10-CM | POA: Insufficient documentation

## 2015-09-20 DIAGNOSIS — Z87440 Personal history of urinary (tract) infections: Secondary | ICD-10-CM | POA: Insufficient documentation

## 2015-09-20 DIAGNOSIS — W19XXXA Unspecified fall, initial encounter: Secondary | ICD-10-CM

## 2015-09-20 DIAGNOSIS — Z79899 Other long term (current) drug therapy: Secondary | ICD-10-CM | POA: Insufficient documentation

## 2015-09-20 MED ORDER — IBUPROFEN 400 MG PO TABS
600.0000 mg | ORAL_TABLET | Freq: Once | ORAL | Status: AC
  Administered 2015-09-20: 600 mg via ORAL
  Filled 2015-09-20: qty 1

## 2015-09-20 MED ORDER — HYDROCODONE-ACETAMINOPHEN 5-325 MG PO TABS: 2.0000 | ORAL_TABLET | ORAL | Status: DC | PRN

## 2015-09-20 MED ORDER — IBUPROFEN 600 MG PO TABS: 600.0000 mg | ORAL_TABLET | Freq: Four times a day (QID) | ORAL | Status: DC | PRN

## 2015-09-20 NOTE — ED Provider Notes (Signed)
CSN: [id number]     Arrival date & time 09/20/15  0801 History   First MD Initiated Contact with Patient 09/20/15 [phone]     Chief Complaint  Patient presents with  . Finger Injury     (Consider location/radiation/quality/duration/timing/severity/associated sxs/prior Treatment) HPI  Patient is a 30 year old female past medical history of asthma who presents the ED with complaint of left hand injury, onset 5 days. Patient reports she tripped and fell and landed on her left hand. Patient endorses initially having swelling to her left knuckles and left pinky finger which has improved over the past few days. Patient endorses having tingling sensation to her left pinky finger. Endorses associated pain to her left hand which is worse with movement. She notes she is unable to straighten the end of her left pinky finger. Patient denies taking any medications or applying ice prior to arrival. Denies fever, redness, warmth, abrasion, laceration.  Past Medical History  Diagnosis Date  . Asthma   . Other and unspecified ovarian cysts   . Infection     uti  . Abnormal Pap smear     biopsy   Past Surgical History  Procedure Laterality Date  . No past surgeries    . Laparoscopic bilateral salpingectomy Bilateral 05/28/2013    Procedure: LAPAROSCOPIC BILATERAL SALPINGECTOMY;  Surgeon: Allie Bossier, MD;  Location: [facility];  Service: Gynecology;  Laterality: Bilateral;   Family History  Problem Relation Age of Onset  . Diabetes Maternal Grandmother   . Hypertension Paternal Grandmother   . Diabetes Paternal Grandmother    Social History  Substance Use Topics  . Smoking status: Former Smoker -- 0.25 packs/day    Types: Cigarettes    Quit date: 02/11/2015  . Smokeless tobacco: Never Used  . Alcohol Use: Yes     Comment: occasional wine cooler   OB History    Gravida Para Term Preterm AB TAB SAB Ectopic Multiple Living   Review of Systems  Constitutional: Negative for fever.   Musculoskeletal: Positive for joint swelling (left hand) and arthralgias (left hand).  Skin: Negative for wound.  Neurological: Negative for numbness.       Tingling      Allergies  Review of patient's allergies indicates no known allergies.  Home Medications   Prior to Admission medications   Medication Sig Start Date End Date Taking? Authorizing Provider  albuterol (PROVENTIL HFA;VENTOLIN HFA) 108 (90 BASE) MCG/ACT inhaler Inhale into the lungs every 6 (six) hours as needed for wheezing or shortness of breath.    Historical Provider, MD  HYDROcodone-acetaminophen (NORCO/VICODIN) 5-325 MG tablet Take 2 tablets by mouth every 4 (four) hours as needed. 09/20/15   Barrett Henle, PA-C  ibuprofen (ADVIL,MOTRIN) 600 MG tablet Take 1 tablet (600 mg total) by mouth every 6 (six) hours as needed. 09/20/15   Barrett Henle, PA-C  methocarbamol (ROBAXIN) 500 MG tablet Take 1 tablet (500 mg total) by mouth 2 (two) times daily. Patient not taking: Reported on 05/13/2015 02/16/15   Catha Gosselin, PA-C  naproxen (NAPROSYN) 500 MG tablet Take 1 tablet (500 mg total) by mouth 2 (two) times daily. 05/13/15   Linwood Dibbles, MD  ondansetron (ZOFRAN ODT) 4 MG disintegrating tablet Take 1 tablet (4 mg total) by mouth every 8 (eight) hours as needed for nausea. Patient not taking: Reported on 05/13/2015 05/06/14   Garlon Hatchet, PA-C   BP 120/68 mmHg  Pulse 63  Temp(Src) 97.9 F (36.6 C) (Oral)  Resp 20  SpO2 100% Physical Exam  Constitutional: She is oriented to person, place, and time. She appears well-developed and well-nourished.  HENT:  Head: Normocephalic and atraumatic.  Eyes: Conjunctivae and EOM are normal. Right eye exhibits no discharge. Left eye exhibits no discharge. No scleral icterus.  Neck: Normal range of motion. Neck supple.  Pulmonary/Chest: Effort normal.  Musculoskeletal:  Left 5th digit at DIP joint resting in partial flexion, pt able to slightly extend DIP  joint, unable to fully extend joint with active ROM due to pain on exam. Full passive ROM with extension of left 5th DIP joint. Mild swelling noted over left 3rd dorsal MCP joint, TTP. TTP over left 5th digit. Dec ROM of left 5th digit due to reported pain. FROM of left digits 1-4 at MCP, PIP and DIP joints and left wrist. 2+ radial pulse. Pt reports tingling sensation to left 5th digit. Cap refill <2.    Neurological: She is alert and oriented to person, place, and time.  Skin: Skin is warm and dry.  Nursing note and vitals reviewed.   ED Course  Procedures (including critical care time) Labs Review Labs Reviewed - No data to display  Imaging Review Dg Hand Complete Left  09/20/2015  CLINICAL DATA:  Pain after fall EXAM: LEFT HAND - COMPLETE 3+ VIEW COMPARISON:  May 13, 2015 FINDINGS: Frontal, oblique, and lateral views were obtained. There is no fracture or dislocation. Joint spaces appear normal. No erosive change. IMPRESSION: No fracture or dislocation.  No appreciable arthropathy. Electronically Signed   By: Bretta BangWilliam  Woodruff III M.D.   On: 09/20/2015 08:40   I have personally reviewed and evaluated these images and lab results as part of my medical decision-making.   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 Denies head injury or LOC. VSS. Exam revealed left fifth digit at DIP joint resting in partial flexion, patient unable to fully extend joint due to reported pain, full passive extension of left 5th DIP joint. Mild swelling noted at left third dorsal MCP joint. Tenderness to palpation over left fifth digit and left third MCP joint. Decreased range of motion of left digits due to reported pain. Left upper extremities otherwise neurovascularly intact. Left hand xray negative for fx or dislocation. Injury appears to be consistent with  mallet finger. Plan to apply finger splint with left 5th DIP joint in full extension. Discussed results and plan for discharge with patient. Advised patient to wear her splint at all times for the next 6 weeks. Discussed symptomatic treatment. Patient given information to follow up with hand surgery advised patient to schedule a follow-up appointment. Discussed return precautions with patient.     Satira Sarkicole Elizabeth WorthingtonNadeau, [place] JerseyPA-C 09/20/15 [phone]  Pricilla LovelessScott Goldston, MD 09/23/15 [phone]

## 2015-09-20 NOTE — ED Notes (Signed)
Pt injured the Lt small finger and Lt hand during a fall several days ago. Pt unable to straighten Lt small finger . Sweeling present to dorsal side of hand.

## 2015-09-20 NOTE — Discharge Instructions (Signed)
Take your medications as prescribed 15 for pain. I recommend wearing your splint at all times. It is important to your splint all day and at night while sleeping in order to help heal your injury. You will need to wear your splint for 6-8 weeks. I also recommend elevating and icing your finger/hand for 15-20 minutes 3-4 times daily to help with pain and swelling. Follow-up with the hand surgeon listed above in the next 1-2 weeks. Return to the emergency department if symptoms worsen or new onset of fever, redness, swelling, warmth, numbness, tingling, weakness.

## 2015-09-20 NOTE — ED Notes (Signed)
Declined W/C at D/C and was escorted to lobby by RN. 

## 2016-05-26 ENCOUNTER — Emergency Department (HOSPITAL_COMMUNITY): Payer: No Typology Code available for payment source

## 2016-05-26 ENCOUNTER — Emergency Department (HOSPITAL_COMMUNITY)
Admission: EM | Admit: 2016-05-26 | Discharge: 2016-05-26 | Disposition: A | Discharge status: Home / Self Care | Payer: No Typology Code available for payment source | Attending: Emergency Medicine | Admitting: Emergency Medicine

## 2016-05-26 ENCOUNTER — Encounter (HOSPITAL_COMMUNITY): Payer: Self-pay

## 2016-05-26 DIAGNOSIS — Y999 Unspecified external cause status: Secondary | ICD-10-CM | POA: Diagnosis not present

## 2016-05-26 DIAGNOSIS — Y939 Activity, unspecified: Secondary | ICD-10-CM | POA: Diagnosis not present

## 2016-05-26 DIAGNOSIS — Z87891 Personal history of nicotine dependence: Secondary | ICD-10-CM | POA: Insufficient documentation

## 2016-05-26 DIAGNOSIS — S239XXA Sprain of unspecified parts of thorax, initial encounter: Secondary | ICD-10-CM

## 2016-05-26 DIAGNOSIS — Y92410 Unspecified street and highway as the place of occurrence of the external cause: Secondary | ICD-10-CM | POA: Insufficient documentation

## 2016-05-26 DIAGNOSIS — S299XXA Unspecified injury of thorax, initial encounter: Secondary | ICD-10-CM | POA: Diagnosis present

## 2016-05-26 DIAGNOSIS — J45909 Unspecified asthma, uncomplicated: Secondary | ICD-10-CM | POA: Diagnosis not present

## 2016-05-26 MED ORDER — CYCLOBENZAPRINE HCL 10 MG PO TABS: 10.0000 mg | ORAL_TABLET | Freq: Two times a day (BID) | ORAL | 0 refills | Status: DC | PRN

## 2016-05-26 MED ORDER — KETOROLAC TROMETHAMINE 60 MG/2ML IM SOLN
30.0000 mg | Freq: Once | INTRAMUSCULAR | Status: AC
  Administered 2016-05-26: 30 mg via INTRAMUSCULAR
  Filled 2016-05-26: qty 2

## 2016-05-26 MED ORDER — NAPROXEN 500 MG PO TABS: 500.0000 mg | ORAL_TABLET | Freq: Two times a day (BID) | ORAL | 0 refills | Status: DC

## 2016-05-26 NOTE — Discharge Instructions (Signed)
Take naproxen as prescribed as needed for pain and inflammation. Take Flexeril for muscle spasms. Try heat. Try stretching. Follow up with the primary care doctor if pain is not improving in 5 days. Return if any numbness, weakness, tingling to extremities, difficulty controlling bowels or bladder, any new concerning symptoms.

## 2016-05-26 NOTE — ED Notes (Signed)
Patient transported to X-ray 
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
## 2016-05-26 NOTE — ED Notes (Signed)
See EDP assessment 

## 2016-08-08 ENCOUNTER — Ambulatory Visit (HOSPITAL_COMMUNITY)
Admission: EM | Admit: 2016-08-08 | Discharge: 2016-08-08 | Disposition: A | Discharge status: Home / Self Care | Payer: Medicaid Other | Attending: Internal Medicine | Admitting: Internal Medicine

## 2016-08-08 ENCOUNTER — Encounter (HOSPITAL_COMMUNITY): Payer: Self-pay | Admitting: Emergency Medicine

## 2016-08-08 DIAGNOSIS — J069 Acute upper respiratory infection, unspecified: Secondary | ICD-10-CM | POA: Diagnosis not present

## 2016-08-08 MED ORDER — BENZONATATE 100 MG PO CAPS: 100.0000 mg | ORAL_CAPSULE | Freq: Three times a day (TID) | ORAL | 0 refills | Status: DC

## 2016-08-08 MED ORDER — AZITHROMYCIN 250 MG PO TABS: 250.0000 mg | ORAL_TABLET | Freq: Every day | ORAL | 0 refills | Status: DC

## 2016-08-08 MED ORDER — GUAIFENESIN ER 600 MG PO TB12: 600.0000 mg | ORAL_TABLET | Freq: Two times a day (BID) | ORAL | 0 refills | Status: DC | PRN

## 2016-08-08 NOTE — ED Triage Notes (Signed)
Pt complains of cough, chest and nasal congestion, nasal drainage and a hoarse voice for two weeks.

## 2016-08-08 NOTE — ED Provider Notes (Signed)
CSN: [id number]     Arrival date & time 08/08/16  1649 History   First MD Initiated Contact with Patient 08/08/16 1747     Chief Complaint  Patient presents with  . URI   (Consider location/radiation/quality/duration/timing/severity/associated sxs/prior Treatment) HPI  Meghan Salazar is a 31 y.o. female presenting to UC with c/o 2 weeks of ongoing cough, congestion, sinus pressure and rhinorrhea with hoarse voice. She has not tried anything for her symptoms. She does note her children have been sick as well. Denies fever, chills, n/v/d.   Past Medical History:  Diagnosis Date  . Abnormal Pap smear    biopsy  . Asthma   . Infection    uti  . Other and unspecified ovarian cysts    Past Surgical History:  Procedure Laterality Date  . LAPAROSCOPIC BILATERAL SALPINGECTOMY Bilateral 05/28/2013   Procedure: LAPAROSCOPIC BILATERAL SALPINGECTOMY;  Surgeon: Allie Bossier, MD;  Location: [facility];  Service: Gynecology;  Laterality: Bilateral;  . NO PAST SURGERIES     Family History  Problem Relation Age of Onset  . Diabetes Maternal Grandmother   . Hypertension Paternal Grandmother   . Diabetes Paternal Grandmother    Social History  Substance Use Topics  . Smoking status: Former Smoker    Packs/day: 0.25    Types: Cigarettes    Quit date: 02/11/2015  . Smokeless tobacco: Never Used  . Alcohol use Yes     Comment: occasional wine cooler   OB History    Gravida Para Term Preterm AB Living   3 3 2 1   3    SAB TAB Ectopic Multiple Live Births           3     Review of Systems  Constitutional: Negative for chills and fever.  HENT: Positive for congestion, postnasal drip, rhinorrhea, sinus pain, sinus pressure, sore throat and voice change. Negative for ear pain and trouble swallowing.   Respiratory: Positive for cough. Negative for shortness of breath.   Cardiovascular: Negative for chest pain and palpitations.  Gastrointestinal: Negative for abdominal pain, diarrhea, nausea and  vomiting.  Musculoskeletal: Negative for arthralgias, back pain and myalgias.  Skin: Negative for rash.    Allergies  Patient has no known allergies.  Home Medications   Prior to Admission medications   Medication Sig Start Date End Date Taking? Authorizing Provider  lamoTRIgine (LAMICTAL) 25 MG tablet Take 25 mg by mouth 2 (two) times daily.   Yes Historical Provider, MD  mirtazapine (REMERON) 15 MG tablet Take 15 mg by mouth at bedtime.   Yes Historical Provider, MD  albuterol (PROVENTIL HFA;VENTOLIN HFA) 108 (90 BASE) MCG/ACT inhaler Inhale into the lungs every 6 (six) hours as needed for wheezing or shortness of breath.    Historical Provider, MD  azithromycin (ZITHROMAX) 250 MG tablet Take 1 tablet (250 mg total) by mouth daily. Take first 2 tablets together, then 1 every day until finished. 08/08/16   Junius Finner, PA-C  benzonatate (TESSALON) 100 MG capsule Take 1 capsule (100 mg total) by mouth every 8 (eight) hours. 08/08/16   Junius Finner, PA-C  cyclobenzaprine (FLEXERIL) 10 MG tablet Take 1 tablet (10 mg total) by mouth 2 (two) times daily as needed for muscle spasms. 05/26/16   Tatyana Kirichenko, PA-C  guaiFENesin (MUCINEX) 600 MG 12 hr tablet Take 1-2 tablets (600-1,200 mg total) by mouth 2 (two) times daily as needed for cough or to loosen phlegm. Take with large glass of water 08/08/16   Junius Finner,  PA-C  HYDROcodone-acetaminophen (NORCO/VICODIN) 5-325 MG tablet Take 2 tablets by mouth every 4 (four) hours as needed. 09/20/15   Barrett HenleNicole Elizabeth Nadeau, PA-C  ibuprofen (ADVIL,MOTRIN) 600 MG tablet Take 1 tablet (600 mg total) by mouth every 6 (six) hours as needed. 09/20/15   Barrett HenleNicole Elizabeth Nadeau, PA-C  naproxen (NAPROSYN) 500 MG tablet Take 1 tablet (500 mg total) by mouth 2 (two) times daily. 05/26/16   Tatyana Kirichenko, PA-C  ondansetron (ZOFRAN ODT) 4 MG disintegrating tablet Take 1 tablet (4 mg total) by mouth every 8 (eight) hours as needed for nausea. Patient not taking:  Reported on 05/13/2015 05/06/14   Garlon HatchetLisa M Sanders, PA-C   Meds Ordered and Administered this Visit  Medications - No data to display  BP 113/64 (BP Location: Right Arm)   Pulse 62   Temp 98.3 F (36.8 C) (Oral)   LMP 08/01/2016 (Approximate)   SpO2 100%  No data found.   Physical Exam  Constitutional: She appears well-developed and well-nourished. No distress.  HENT:  Head: Normocephalic and atraumatic.  Right Ear: Tympanic membrane normal.  Left Ear: Tympanic membrane normal.  Nose: Mucosal edema present.  Mouth/Throat: Uvula is midline, oropharynx is clear and moist and mucous membranes are normal.  Eyes: Conjunctivae are normal. No scleral icterus.  Neck: Normal range of motion. Neck supple.  Cardiovascular: Normal rate, regular rhythm and normal heart sounds.   Pulmonary/Chest: Effort normal and breath sounds normal. No stridor. No respiratory distress. She has no wheezes. She has no rales.  Musculoskeletal: Normal range of motion.  Lymphadenopathy:    She has no cervical adenopathy.  Neurological: She is alert.  Skin: Skin is warm and dry. She is not diaphoretic.  Nursing note and vitals reviewed.   Urgent Care Course     Procedures (including critical care time)  Labs Review Labs Reviewed - No data to display  Imaging Review No results found.    MDM   1. Upper respiratory tract infection, unspecified type    Hx and exam c/w URI.  Due to duration of symptoms, will cover for bacterial cause Rx: Azithromycin, tessalon, and mucinex  f/u with PCP in 1 week if not improving.     Junius FinnerErin O'Malley, PA-C 08/08/16 1757

## 2016-08-20 ENCOUNTER — Ambulatory Visit (HOSPITAL_COMMUNITY)
Admission: EM | Admit: 2016-08-20 | Discharge: 2016-08-20 | Disposition: A | Discharge status: Home / Self Care | Payer: Medicaid Other | Attending: Internal Medicine | Admitting: Internal Medicine

## 2016-08-20 ENCOUNTER — Encounter (HOSPITAL_COMMUNITY): Payer: Self-pay | Admitting: Emergency Medicine

## 2016-08-20 DIAGNOSIS — N3001 Acute cystitis with hematuria: Secondary | ICD-10-CM | POA: Insufficient documentation

## 2016-08-20 DIAGNOSIS — Z113 Encounter for screening for infections with a predominantly sexual mode of transmission: Secondary | ICD-10-CM

## 2016-08-20 DIAGNOSIS — R350 Frequency of micturition: Secondary | ICD-10-CM | POA: Insufficient documentation

## 2016-08-20 DIAGNOSIS — Z79899 Other long term (current) drug therapy: Secondary | ICD-10-CM | POA: Diagnosis not present

## 2016-08-20 DIAGNOSIS — Z87891 Personal history of nicotine dependence: Secondary | ICD-10-CM | POA: Insufficient documentation

## 2016-08-20 LAB — POCT URINALYSIS DIP (DEVICE)
Bilirubin Urine: NEGATIVE
Glucose, UA: NEGATIVE mg/dL
Ketones, ur: NEGATIVE mg/dL
Nitrite: NEGATIVE
Protein, ur: NEGATIVE mg/dL
SPECIFIC GRAVITY, URINE: 1.02 (ref 1.005–1.030)
UROBILINOGEN UA: 0.2 mg/dL (ref 0.0–1.0)
pH: 7 (ref 5.0–8.0)

## 2016-08-20 MED ORDER — NITROFURANTOIN MONOHYD MACRO 100 MG PO CAPS: 100.0000 mg | ORAL_CAPSULE | Freq: Two times a day (BID) | ORAL | 0 refills | Status: AC

## 2016-08-20 NOTE — Discharge Instructions (Signed)
Your urine came back showing that you may have an UTI. I will treat presumptively for UTI today.  If your urine culture comes back negative, then please follow up with your primary care doctor in 1-2 weeks for urine recheck to make sure the blood in urine has resolved.   The STD and HIV testing will come back in 3-5 days. And we will call you with the results.

## 2016-08-20 NOTE — ED Provider Notes (Signed)
CSN: [id number]     Arrival date & time 08/20/16  1333 History   First MD Initiated Contact with Patient 08/20/16 1501     Chief Complaint  Patient presents with  . Urinary Tract Infection   (Consider location/radiation/quality/duration/timing/severity/associated sxs/prior Treatment) Patient is a 31 y.o. Female, appears well, have been experiencing lower mid abdominal pressure during urination for the last 2 days. She is also noticing urinary frequency and urgency. She however has no dysuria. Patient denies any changes in her normal vaginal discharge but is requesting STD testing because her boyfriend have been cheating on her. She would also like HIV testing.        Past Medical History:  Diagnosis Date  . Abnormal Pap smear    biopsy  . Asthma   . Infection    uti  . Other and unspecified ovarian cysts    Past Surgical History:  Procedure Laterality Date  . LAPAROSCOPIC BILATERAL SALPINGECTOMY Bilateral 05/28/2013   Procedure: LAPAROSCOPIC BILATERAL SALPINGECTOMY;  Surgeon: Allie Bossier, MD;  Location: [facility];  Service: Gynecology;  Laterality: Bilateral;  . NO PAST SURGERIES     Family History  Problem Relation Age of Onset  . Diabetes Maternal Grandmother   . Hypertension Paternal Grandmother   . Diabetes Paternal Grandmother    Social History  Substance Use Topics  . Smoking status: Former Smoker    Packs/day: 0.25    Types: Cigarettes    Quit date: 02/11/2015  . Smokeless tobacco: Never Used  . Alcohol use Yes     Comment: occasional wine cooler   OB History    Gravida Para Term Preterm AB Living   SAB TAB Ectopic Multiple Live Births           3     Review of Systems  Constitutional:       As stated in the HPI    Allergies  Patient has no known allergies.  Home Medications   Prior to Admission medications   Medication Sig Start Date End Date Taking? Authorizing Provider  lamoTRIgine (LAMICTAL) 25 MG tablet Take 25 mg by mouth 2 (two)  times daily.   Yes Historical Provider, MD  mirtazapine (REMERON) 15 MG tablet Take 15 mg by mouth at bedtime.   Yes Historical Provider, MD  albuterol (PROVENTIL HFA;VENTOLIN HFA) 108 (90 BASE) MCG/ACT inhaler Inhale into the lungs every 6 (six) hours as needed for wheezing or shortness of breath.    Historical Provider, MD  azithromycin (ZITHROMAX) 250 MG tablet Take 1 tablet (250 mg total) by mouth daily. Take first 2 tablets together, then 1 every day until finished. 08/08/16   Junius Finner, PA-C  benzonatate (TESSALON) 100 MG capsule Take 1 capsule (100 mg total) by mouth every 8 (eight) hours. 08/08/16   Junius Finner, PA-C  cyclobenzaprine (FLEXERIL) 10 MG tablet Take 1 tablet (10 mg total) by mouth 2 (two) times daily as needed for muscle spasms. 05/26/16   Tatyana Kirichenko, PA-C  guaiFENesin (MUCINEX) 600 MG 12 hr tablet Take 1-2 tablets (600-1,200 mg total) by mouth 2 (two) times daily as needed for cough or to loosen phlegm. Take with large glass of water 08/08/16   Junius Finner, PA-C  HYDROcodone-acetaminophen (NORCO/VICODIN) 5-325 MG tablet Take 2 tablets by mouth every 4 (four) hours as needed. 09/20/15   Barrett Henle, PA-C  ibuprofen (ADVIL,MOTRIN) 600 MG tablet Take 1 tablet (600 mg total) by mouth every 6 (  six) hours as needed. 09/20/15   Barrett Henle, PA-C  naproxen (NAPROSYN) 500 MG tablet Take 1 tablet (500 mg total) by mouth 2 (two) times daily. 05/26/16   Tatyana Kirichenko, PA-C  nitrofurantoin, macrocrystal-monohydrate, (MACROBID) 100 MG capsule Take 1 capsule (100 mg total) by mouth 2 (two) times daily. 08/20/16 08/25/16  Lucia Estelle, NP  ondansetron (ZOFRAN ODT) 4 MG disintegrating tablet Take 1 tablet (4 mg total) by mouth every 8 (eight) hours as needed for nausea. Patient not taking: Reported on 05/13/2015 05/06/14   Garlon Hatchet, PA-C   Meds Ordered and Administered this Visit  Medications - No data to display  BP 121/62   Pulse (!) 52   Temp 98 F  (36.7 C) (Oral)   Resp 16   Ht  (1.727 m)   Wt 180 lb (81.6 kg)   LMP 08/01/2016 (Approximate)   SpO2 100%   BMI 27.37 kg/m  No data found.   Physical Exam  Constitutional: She is oriented to person, place, and time. She appears well-developed and well-nourished.  Cardiovascular: Normal rate, regular rhythm and normal heart sounds.   Pulmonary/Chest: Effort normal and breath sounds normal. No respiratory distress. She has no wheezes.  Abdominal: Soft. Bowel sounds are normal. She exhibits no distension. There is no tenderness.  Genitourinary:  Genitourinary Comments: Negative CVA tenderness External labia majora and minora symmetrical with no lesion. Vaginal canal pink and moist with lesion. Small amt of white discharge noted in the canal. -CMT, -adnexal mass/tenderness, -uterine tenderness  Neurological: She is alert and oriented to person, place, and time.  Skin: Skin is warm and dry.  Psychiatric: She has a normal mood and affect.  Nursing note and vitals reviewed.   Urgent Care Course     Procedures (including critical care time)  Labs Review Labs Reviewed  POCT URINALYSIS DIP (DEVICE) - Abnormal; Notable for the following:       Result Value   Hgb urine dipstick TRACE (*)    Leukocytes, UA SMALL (*)    All other components within normal limits  URINE CULTURE  HIV ANTIBODY (ROUTINE TESTING)  CERVICOVAGINAL ANCILLARY ONLY    Imaging Review No results found.  MDM   1. Acute cystitis with hematuria    1) UA has trace hemoglobin and Small Leukocytes. Will treat presumptively for UTI given her abdominal pressure, urinary frequency and Urgency 2) Cytology is pending for STI, BV and Yeast  3) HIV testing is pending 4) Will hold off STI treatment until results come back.  5) All questions answered. Discharge instruction given.    Lucia Estelle, NP 08/20/16 1540

## 2016-08-20 NOTE — ED Triage Notes (Signed)
PT reports pressure during and after urination for 2 days. PT also reports she just found out her partner has been unfaithful and would like to be checked for STDs as well.

## 2016-08-21 LAB — HIV ANTIBODY (ROUTINE TESTING W REFLEX): HIV Screen 4th Generation wRfx: NONREACTIVE

## 2016-08-22 ENCOUNTER — Telehealth (HOSPITAL_COMMUNITY): Payer: Self-pay | Admitting: Internal Medicine

## 2016-08-22 LAB — CERVICOVAGINAL ANCILLARY ONLY
CHLAMYDIA, DNA PROBE: NEGATIVE
Neisseria Gonorrhea: NEGATIVE
Wet Prep (BD Affirm): POSITIVE — AB

## 2016-08-22 LAB — URINE CULTURE

## 2016-08-22 NOTE — Telephone Encounter (Signed)
Note opened in error.  LM 

## 2016-09-20 ENCOUNTER — Emergency Department (HOSPITAL_COMMUNITY): Payer: No Typology Code available for payment source

## 2016-09-20 ENCOUNTER — Encounter (HOSPITAL_COMMUNITY): Payer: Self-pay

## 2016-09-20 ENCOUNTER — Emergency Department (HOSPITAL_COMMUNITY)
Admission: EM | Admit: 2016-09-20 | Discharge: 2016-09-20 | Disposition: A | Discharge status: Home / Self Care | Payer: No Typology Code available for payment source | Attending: Emergency Medicine | Admitting: Emergency Medicine

## 2016-09-20 DIAGNOSIS — S41112A Laceration without foreign body of left upper arm, initial encounter: Secondary | ICD-10-CM | POA: Insufficient documentation

## 2016-09-20 DIAGNOSIS — Z87891 Personal history of nicotine dependence: Secondary | ICD-10-CM | POA: Insufficient documentation

## 2016-09-20 DIAGNOSIS — Y939 Activity, unspecified: Secondary | ICD-10-CM | POA: Diagnosis not present

## 2016-09-20 DIAGNOSIS — Y999 Unspecified external cause status: Secondary | ICD-10-CM | POA: Insufficient documentation

## 2016-09-20 DIAGNOSIS — Y92410 Unspecified street and highway as the place of occurrence of the external cause: Secondary | ICD-10-CM | POA: Insufficient documentation

## 2016-09-20 DIAGNOSIS — S4991XA Unspecified injury of right shoulder and upper arm, initial encounter: Secondary | ICD-10-CM | POA: Diagnosis present

## 2016-09-20 DIAGNOSIS — J45909 Unspecified asthma, uncomplicated: Secondary | ICD-10-CM | POA: Insufficient documentation

## 2016-09-20 DIAGNOSIS — S39012A Strain of muscle, fascia and tendon of lower back, initial encounter: Secondary | ICD-10-CM | POA: Diagnosis not present

## 2016-09-20 DIAGNOSIS — M79602 Pain in left arm: Secondary | ICD-10-CM

## 2016-09-20 HISTORY — DX: Bipolar disorder, unspecified: F31.9

## 2016-09-20 HISTORY — DX: Post-traumatic stress disorder, unspecified: F43.10

## 2016-09-20 HISTORY — DX: Anxiety disorder, unspecified: F41.9

## 2016-09-20 LAB — POC URINE PREG, ED: Preg Test, Ur: NEGATIVE

## 2016-09-20 MED ORDER — KETOROLAC TROMETHAMINE 60 MG/2ML IM SOLN
30.0000 mg | Freq: Once | INTRAMUSCULAR | Status: AC
  Administered 2016-09-20: 30 mg via INTRAMUSCULAR
  Filled 2016-09-20: qty 2

## 2016-09-20 NOTE — Discharge Instructions (Signed)
Take ibuprofen and tylenol as directed for inflammation and pain. Ice to areas of soreness for the next few days and then may move to heat. Expect to be sore for the next few day and follow up with primary care physician for recheck of ongoing symptoms but return to ER for emergent changing or worsening of symptoms.
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
## 2016-09-20 NOTE — ED Notes (Signed)
Patient transported to X-ray 

## 2016-09-20 NOTE — ED Notes (Signed)
Pt stable, understands discharge instructions, and reasons for return.   

## 2016-09-20 NOTE — ED Notes (Signed)
Pt back from x-ray.
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
## 2018-05-21 ENCOUNTER — Encounter (HOSPITAL_COMMUNITY): Payer: Self-pay | Admitting: Emergency Medicine

## 2018-05-21 ENCOUNTER — Ambulatory Visit (HOSPITAL_COMMUNITY)
Admission: EM | Admit: 2018-05-21 | Discharge: 2018-05-21 | Disposition: A | Discharge status: Home / Self Care | Payer: Managed Care, Other (non HMO) | Attending: Internal Medicine | Admitting: Internal Medicine

## 2018-05-21 ENCOUNTER — Ambulatory Visit (INDEPENDENT_AMBULATORY_CARE_PROVIDER_SITE_OTHER): Payer: Managed Care, Other (non HMO)

## 2018-05-21 DIAGNOSIS — R05 Cough: Secondary | ICD-10-CM

## 2018-05-21 DIAGNOSIS — J40 Bronchitis, not specified as acute or chronic: Secondary | ICD-10-CM | POA: Insufficient documentation

## 2018-05-21 DIAGNOSIS — R0789 Other chest pain: Secondary | ICD-10-CM

## 2018-05-21 DIAGNOSIS — R5383 Other fatigue: Secondary | ICD-10-CM

## 2018-05-21 MED ORDER — IPRATROPIUM-ALBUTEROL 0.5-2.5 (3) MG/3ML IN SOLN
3.0000 mL | Freq: Once | RESPIRATORY_TRACT | Status: AC
  Administered 2018-05-21: 3 mL via RESPIRATORY_TRACT

## 2018-05-21 MED ORDER — BENZONATATE 100 MG PO CAPS: 100.0000 mg | ORAL_CAPSULE | Freq: Three times a day (TID) | ORAL | 0 refills | Status: DC

## 2018-05-21 MED ORDER — AZITHROMYCIN 250 MG PO TABS: 250.0000 mg | ORAL_TABLET | Freq: Every day | ORAL | 0 refills | Status: DC

## 2018-05-21 MED ORDER — ALBUTEROL SULFATE HFA 108 (90 BASE) MCG/ACT IN AERS: 1.0000 | INHALATION_SPRAY | Freq: Four times a day (QID) | RESPIRATORY_TRACT | 1 refills | Status: DC | PRN

## 2018-05-21 MED ORDER — IPRATROPIUM-ALBUTEROL 0.5-2.5 (3) MG/3ML IN SOLN
RESPIRATORY_TRACT | Status: AC
  Filled 2018-05-21: qty 3

## 2018-05-21 MED ORDER — GUAIFENESIN ER 600 MG PO TB12: 600.0000 mg | ORAL_TABLET | Freq: Two times a day (BID) | ORAL | 0 refills | Status: DC | PRN

## 2018-05-21 MED ORDER — PREDNISONE 10 MG PO TABS: 40.0000 mg | ORAL_TABLET | Freq: Every day | ORAL | 0 refills | Status: AC

## 2018-05-21 NOTE — ED Provider Notes (Signed)
[facility]    CSN: [id number] Arrival date & time: 05/21/18  [id number]     History   Chief Complaint Chief Complaint  Patient presents with  . Influenza    HPI Meghan Salazar is a 33 y.o. female.   Patient is a 33 year old female that presents with cough, congestion that has worsened over the past week.  She has had body aches, fatigue, low-grade fevers.  Her kids have been sick with similar symptoms.  She has been treating herself with Mucinex, TheraFlu, Tylenol, hot tea, Vicks VapoRub..  These medications are not helping much with her symptoms.  She does have a past medical history of asthma.  She works in a nursing home.  ROS per HPI      Past Medical History:  Diagnosis Date  . Abnormal Pap smear    biopsy  . Anxiety   . Asthma   . Bipolar 1 disorder (HCC)   . Infection    uti  . Other and unspecified ovarian cysts   . PTSD (post-traumatic stress disorder)     Patient Active Problem List   Diagnosis Date Noted  . Consultation for female sterilization 05/28/2013  . Bacterial vaginosis 03/03/2013  . Short cervix affecting pregnancy 03/03/2013  . Insufficient prenatal care in second trimester 03/03/2013  . Pyelonephritis complicating pregnancy in second trimester, antepartum 12/13/2012    Past Surgical History:  Procedure Laterality Date  . LAPAROSCOPIC BILATERAL SALPINGECTOMY Bilateral 05/28/2013   Procedure: LAPAROSCOPIC BILATERAL SALPINGECTOMY;  Surgeon: Allie BossierMyra C Dove, MD;  Location: [facility];  Service: Gynecology;  Laterality: Bilateral;  . NO PAST SURGERIES      OB History    Gravida  3   Para  3   Term  2   Preterm  1   AB      Living  3     SAB      TAB      Ectopic      Multiple      Live Births  3            Home Medications    Prior to Admission medications   Medication Sig Start Date End Date Taking? Authorizing Provider  albuterol (PROVENTIL HFA;VENTOLIN HFA) 108 (90 Base) MCG/ACT inhaler Inhale 1-2 puffs into the  lungs every 6 (six) hours as needed for wheezing or shortness of breath. 05/21/18   Dahlia ByesBast, Mckinzy Fuller A, NP  azithromycin (ZITHROMAX) 250 MG tablet Take 1 tablet (250 mg total) by mouth daily. Take first 2 tablets together, then 1 every day until finished. 05/21/18   Adhya Cocco, Gloris Manchesterraci A, NP  benzonatate (TESSALON) 100 MG capsule Take 1 capsule (100 mg total) by mouth every 8 (eight) hours. 05/21/18   Dahlia ByesBast, Adriauna Campton A, NP  cyclobenzaprine (FLEXERIL) 10 MG tablet Take 1 tablet (10 mg total) by mouth 2 (two) times daily as needed for muscle spasms. 05/26/16   Kirichenko, Tatyana, PA-C  guaiFENesin (MUCINEX) 600 MG 12 hr tablet Take 1-2 tablets (600-1,200 mg total) by mouth 2 (two) times daily as needed for cough or to loosen phlegm. Take with large glass of water 05/21/18   Dahlia ByesBast, Leyan Branden A, NP  ibuprofen (ADVIL,MOTRIN) 600 MG tablet Take 1 tablet (600 mg total) by mouth every 6 (six) hours as needed. 09/20/15   Barrett HenleNadeau, Nicole Elizabeth, PA-C  lamoTRIgine (LAMICTAL) 25 MG tablet Take 25 mg by mouth 2 (two) times daily.    [provider]  mirtazapine (REMERON) 15 MG tablet Take 15 mg  by mouth at bedtime.    [provider]  naproxen (NAPROSYN) 500 MG tablet Take 1 tablet (500 mg total) by mouth 2 (two) times daily. 05/26/16   Kirichenko, Lemont Fillersatyana, PA-C  predniSONE (DELTASONE) 10 MG tablet Take 4 tablets (40 mg total) by mouth daily for 5 days. 05/21/18 05/26/18  Janace ArisBast, Tiannah Greenly A, NP    Family History Family History  Problem Relation Age of Onset  . Diabetes Maternal Grandmother   . Hypertension Paternal Grandmother   . Diabetes Paternal Grandmother     Social History Social History   Tobacco Use  . Smoking status: Former Smoker    Packs/day: 0.25    Types: Cigarettes    Last attempt to quit: 02/11/2015    Years since quitting: 3.2  . Smokeless tobacco: Never Used  Substance Use Topics  . Alcohol use: Yes    Comment: occasional wine cooler  . Drug use: No     Allergies   Patient has no known  allergies.   Review of Systems Review of Systems   Physical Exam Triage Vital Signs ED Triage Vitals  Enc Vitals Group     BP 05/21/18 1023 (!) 145/95     Pulse Rate 05/21/18 1023 66     Resp 05/21/18 1023 16     Temp 05/21/18 1023 98.6 F (37 C)     Temp Source 05/21/18 1023 Oral     SpO2 05/21/18 1023 99 %     Weight --      Height --      Head Circumference --      Peak Flow --      Pain Score 05/21/18 1029 6     Pain Loc --      Pain Edu? --      Excl. in GC? --    No data found.  Updated Vital Signs BP (!) 145/95 (BP Location: Left Arm)   Pulse 66   Temp 98.6 F (37 C) (Oral)   Resp 16   SpO2 99%   Visual Acuity Right Eye Distance:   Left Eye Distance:   Bilateral Distance:    Right Eye Near:   Left Eye Near:    Bilateral Near:     Physical Exam Vitals signs and nursing note reviewed.  Constitutional:      Appearance: Normal appearance.  HENT:     Head: Normocephalic and atraumatic.     Right Ear: Tympanic membrane, ear canal and external ear normal.     Left Ear: Tympanic membrane, ear canal and external ear normal.     Nose: Congestion and rhinorrhea present.     Mouth/Throat:     Pharynx: Oropharynx is clear.     Comments: PND Eyes:     Conjunctiva/sclera: Conjunctivae normal.  Neck:     Musculoskeletal: Normal range of motion.  Cardiovascular:     Rate and Rhythm: Normal rate and regular rhythm.     Heart sounds: Normal heart sounds.  Pulmonary:     Effort: Pulmonary effort is normal.     Breath sounds: Wheezing and rales present.  Musculoskeletal: Normal range of motion.  Lymphadenopathy:     Cervical: No cervical adenopathy.  Skin:    General: Skin is warm and dry.  Neurological:     Mental Status: She is alert.  Psychiatric:        Mood and Affect: Mood normal.      UC Treatments / Results  Labs (all labs ordered are listed,  but only abnormal results are displayed) Labs Reviewed - No data to  display  EKG None  Radiology Dg Chest 2 View  Result Date: 05/21/2018 CLINICAL DATA:  Cough, chest tightness and fatigue EXAM: CHEST - 2 VIEW COMPARISON:  05/13/2015 chest radiograph. FINDINGS: Stable cardiomediastinal silhouette with normal heart size. No pneumothorax. No pleural effusion. Lungs appear clear, with no acute consolidative airspace disease and no pulmonary edema. IMPRESSION: No active cardiopulmonary disease. Electronically Signed   By: Delbert Phenix M.D.   On: 05/21/2018 10:49    Procedures Procedures (including critical care time)  Medications Ordered in UC Medications  ipratropium-albuterol (DUONEB) 0.5-2.5 (3) MG/3ML nebulizer solution 3 mL (3 mLs Nebulization Given 05/21/18 1051)    Initial Impression / Assessment and Plan / UC Course  I have reviewed the triage vital signs and the nursing notes.  Pertinent labs & imaging results that were available during my care of the patient were reviewed by me and considered in my medical decision making (see chart for details).     Patient is a 33 year old female presents with cough, congestion that is worsening over the past week. Rales and wheezes heard upon exam We will give DuoNeb in clinic and chest x-ray to rule out pneumonia.  X-ray negative for pneumonia We will go ahead and treat for bronchitis Prednisone daily for the next 5 days Continue taking the Mucinex Ibuprofen for body aches Will do Tessalon Perles for cough Albuterol inhaler to use for cough, wheezing, shortness of breath Printed prescription given for azithromycin to use in the next 48 to 72 hours if no improvement Final Clinical Impressions(s) / UC Diagnoses   Final diagnoses:  Bronchitis     Discharge Instructions     Your x-ray was negative for pneumonia We will go ahead and treat you today for bronchitis Prednisone daily for 5 days.  Make sure you take this with food. Continue the Mucinex every 12 hours for cough, chest  congestion Tessalon Perles for worsening cough Albuterol inhaler to use for cough, wheezing, shortness of breath If you are not better in the next 48 to 72 hours you can go ahead and fill the azithromycin at that point Follow up as needed for continued or worsening symptoms      ED Prescriptions    Medication Sig Dispense Auth. Provider   albuterol (PROVENTIL HFA;VENTOLIN HFA) 108 (90 Base) MCG/ACT inhaler Inhale 1-2 puffs into the lungs every 6 (six) hours as needed for wheezing or shortness of breath. 1 Inhaler Amiley Shishido A, NP   benzonatate (TESSALON) 100 MG capsule Take 1 capsule (100 mg total) by mouth every 8 (eight) hours. 21 capsule Karlisha Mathena A, NP   guaiFENesin (MUCINEX) 600 MG 12 hr tablet Take 1-2 tablets (600-1,200 mg total) by mouth 2 (two) times daily as needed for cough or to loosen phlegm. Take with large glass of water 20 tablet Derrien Anschutz A, NP   predniSONE (DELTASONE) 10 MG tablet Take 4 tablets (40 mg total) by mouth daily for 5 days. 20 tablet Gaige Fussner A, NP   azithromycin (ZITHROMAX) 250 MG tablet Take 1 tablet (250 mg total) by mouth daily. Take first 2 tablets together, then 1 every day until finished. 6 tablet Dahlia Byes A, NP     Controlled Substance Prescriptions [place] Controlled Substance Registry consulted? Not Applicable   Janace Aris, NP 05/21/18 [phone]

## 2018-05-21 NOTE — ED Triage Notes (Signed)
Pt here for flu like sx.

## 2018-05-21 NOTE — Discharge Instructions (Signed)
Your x-ray was negative for pneumonia We will go ahead and treat you today for bronchitis Prednisone daily for 5 days.  Make sure you take this with food. Continue the Mucinex every 12 hours for cough, chest congestion Tessalon Perles for worsening cough Albuterol inhaler to use for cough, wheezing, shortness of breath If you are not better in the next 48 to 72 hours you can go ahead and fill the azithromycin at that point Follow up as needed for continued or worsening symptoms

## 2018-06-18 ENCOUNTER — Encounter (HOSPITAL_COMMUNITY): Payer: Self-pay | Admitting: Emergency Medicine

## 2018-06-18 ENCOUNTER — Emergency Department (HOSPITAL_COMMUNITY): Payer: Managed Care, Other (non HMO)

## 2018-06-18 ENCOUNTER — Emergency Department (HOSPITAL_COMMUNITY)
Admission: EM | Admit: 2018-06-18 | Discharge: 2018-06-18 | Disposition: A | Discharge status: Home / Self Care | Payer: Managed Care, Other (non HMO) | Attending: Emergency Medicine | Admitting: Emergency Medicine

## 2018-06-18 ENCOUNTER — Other Ambulatory Visit: Payer: Self-pay

## 2018-06-18 DIAGNOSIS — J069 Acute upper respiratory infection, unspecified: Secondary | ICD-10-CM | POA: Diagnosis not present

## 2018-06-18 DIAGNOSIS — Z87891 Personal history of nicotine dependence: Secondary | ICD-10-CM | POA: Diagnosis not present

## 2018-06-18 DIAGNOSIS — Z79899 Other long term (current) drug therapy: Secondary | ICD-10-CM | POA: Diagnosis not present

## 2018-06-18 DIAGNOSIS — J45909 Unspecified asthma, uncomplicated: Secondary | ICD-10-CM | POA: Diagnosis not present

## 2018-06-18 DIAGNOSIS — B9789 Other viral agents as the cause of diseases classified elsewhere: Secondary | ICD-10-CM

## 2018-06-18 DIAGNOSIS — R05 Cough: Secondary | ICD-10-CM | POA: Diagnosis present

## 2018-06-18 MED ORDER — FLUTICASONE PROPIONATE 50 MCG/ACT NA SUSP: 2.0000 | Freq: Every day | NASAL | 0 refills | Status: DC

## 2018-06-18 MED ORDER — ALBUTEROL SULFATE HFA 108 (90 BASE) MCG/ACT IN AERS
1.0000 | INHALATION_SPRAY | Freq: Once | RESPIRATORY_TRACT | Status: AC
  Administered 2018-06-18: 1 via RESPIRATORY_TRACT
  Filled 2018-06-18: qty 6.7

## 2018-06-18 MED ORDER — BENZONATATE 100 MG PO CAPS: 100.0000 mg | ORAL_CAPSULE | Freq: Three times a day (TID) | ORAL | 0 refills | Status: DC | PRN

## 2018-06-18 MED ORDER — CETIRIZINE HCL 10 MG PO TABS: 10.0000 mg | ORAL_TABLET | Freq: Every day | ORAL | 0 refills | Status: DC

## 2018-06-18 NOTE — ED Triage Notes (Signed)
Pt arrives to ED from home with complaints of cough, chills, and neck pain for the past couple of weeks. Pt reports she was diagnosed with pneumonia in early January.

## 2018-06-18 NOTE — Discharge Instructions (Signed)
Your chest x-ray was normal.  Drink plenty water and get plenty of rest. Take flonase to decrease nasal congestion. Zyrtec for nasal congestion and scratchy throat.  Tessalon as needed for cough.  Use your albuterol inhaler as needed for shortness of breath 1 to 2 puffs every 4-6 hours.  Alternate 600 mg of ibuprofen and [phone] mg of Tylenol every 3 hours as needed for pain or fever. Do not exceed 4000 mg of Tylenol daily.  Take ibuprofen with food to avoid upset stomach issues.   Followup with your primary care doctor in 5-7 days for recheck of ongoing symptoms. Return to emergency department for emergent changing or worsening of symptoms such as throat tightness, facial swelling, fever not controlled by ibuprofen or Tylenol,difficulty breathing, or chest pain.

## 2018-06-18 NOTE — ED Notes (Signed)
Patient verbalizes understanding of discharge instructions. Opportunity for questioning and answers were provided. Armband removed by staff, pt discharged from ED.  

## 2018-06-18 NOTE — ED Provider Notes (Signed)
[facility] Provider Note   CSN: [id number] Arrival date & time: 06/18/18  [id number]     History   Chief Complaint Chief Complaint  Patient presents with  . Cough    HPI Meghan Salazar is a 33 y.o. female with history of asthma, bipolar 1 disorder, anxiety, PTSD presenting for evaluation of acute onset, persistent cough for approximately 1 month.  Reports that she was seen and evaluated at an urgent care last week and diagnosed with pneumonia and given a prescription for azithromycin and prednisone which she took to completion.  She reports that she felt better at that time but more recently in the past couple of weeks she has been feeling worse.  She notes a nonproductive cough, nasal congestion, myalgias, subjective fevers and chills.  Denies chest pain, abdominal pain, or shortness of breath.  She is a former smoker, quit last year.  She has been taking over-the-counter medicines without significant relief.  She does feel as though she is wheezing in the mornings.  She ran out of her albuterol inhaler.  The history is provided by the patient.    Past Medical History:  Diagnosis Date  . Abnormal Pap smear    biopsy  . Anxiety   . Asthma   . Bipolar 1 disorder (HCC)   . Infection    uti  . Other and unspecified ovarian cysts   . PTSD (post-traumatic stress disorder)     Patient Active Problem List   Diagnosis Date Noted  . Consultation for female sterilization 05/28/2013  . Bacterial vaginosis 03/03/2013  . Short cervix affecting pregnancy 03/03/2013  . Insufficient prenatal care in second trimester 03/03/2013  . Pyelonephritis complicating pregnancy in second trimester, antepartum 12/13/2012    Past Surgical History:  Procedure Laterality Date  . LAPAROSCOPIC BILATERAL SALPINGECTOMY Bilateral 05/28/2013   Procedure: LAPAROSCOPIC BILATERAL SALPINGECTOMY;  Surgeon: Allie BossierMyra C Dove, MD;  Location: [facility];  Service: Gynecology;  Laterality:  Bilateral;  . NO PAST SURGERIES       OB History    Gravida  3   Para  3   Term  2   Preterm  1   AB      Living  3     SAB      TAB      Ectopic      Multiple      Live Births  3            Home Medications    Prior to Admission medications   Medication Sig Start Date End Date Taking? Authorizing Provider  albuterol (PROVENTIL HFA;VENTOLIN HFA) 108 (90 Base) MCG/ACT inhaler Inhale 1-2 puffs into the lungs every 6 (six) hours as needed for wheezing or shortness of breath. 05/21/18   Dahlia ByesBast, Traci A, NP  azithromycin (ZITHROMAX) 250 MG tablet Take 1 tablet (250 mg total) by mouth daily. Take first 2 tablets together, then 1 every day until finished. 05/21/18   Dahlia ByesBast, Traci A, NP  benzonatate (TESSALON) 100 MG capsule Take 1 capsule (100 mg total) by mouth 3 (three) times daily as needed for cough. 06/18/18   Luevenia MaxinFawze, Deazia Lampi A, PA-C  cetirizine (ZYRTEC ALLERGY) 10 MG tablet Take 1 tablet (10 mg total) by mouth daily. 06/18/18   Luevenia MaxinFawze, Sia Gabrielsen A, PA-C  cyclobenzaprine (FLEXERIL) 10 MG tablet Take 1 tablet (10 mg total) by mouth 2 (two) times daily as needed for muscle spasms. 05/26/16   Kirichenko, Tatyana, PA-C  fluticasone (FLONASE) 50  MCG/ACT nasal spray Place 2 sprays into both nostrils daily. 06/18/18   Luevenia Maxin, Peace Noyes A, PA-C  guaiFENesin (MUCINEX) 600 MG 12 hr tablet Take 1-2 tablets (600-1,200 mg total) by mouth 2 (two) times daily as needed for cough or to loosen phlegm. Take with large glass of water 05/21/18   Dahlia Byes A, NP  ibuprofen (ADVIL,MOTRIN) 600 MG tablet Take 1 tablet (600 mg total) by mouth every 6 (six) hours as needed. 09/20/15   Barrett Henle, PA-C  lamoTRIgine (LAMICTAL) 25 MG tablet Take 25 mg by mouth 2 (two) times daily.    [provider]  mirtazapine (REMERON) 15 MG tablet Take 15 mg by mouth at bedtime.    [provider]  naproxen (NAPROSYN) 500 MG tablet Take 1 tablet (500 mg total) by mouth 2 (two) times daily. 05/26/16   Jaynie Crumble, PA-C    Family History Family History  Problem Relation Age of Onset  . Diabetes Maternal Grandmother   . Hypertension Paternal Grandmother   . Diabetes Paternal Grandmother     Social History Social History   Tobacco Use  . Smoking status: Former Smoker    Packs/day: 0.25    Types: Cigarettes    Last attempt to quit: 02/11/2015    Years since quitting: 3.3  . Smokeless tobacco: Never Used  Substance Use Topics  . Alcohol use: Yes    Comment: occasional wine cooler  . Drug use: No     Allergies   Patient has no known allergies.   Review of Systems Review of Systems  Constitutional: Positive for chills and fever.  HENT: Positive for congestion. Negative for sore throat.   Respiratory: Positive for cough and wheezing. Negative for shortness of breath.   Cardiovascular: Negative for chest pain.  Gastrointestinal: Negative for abdominal pain, nausea and vomiting.  Musculoskeletal: Positive for myalgias.  All other systems reviewed and are negative.    Physical Exam Updated Vital Signs BP 103/79 (BP Location: Left Arm)   Pulse 83   Temp 98.6 F (37 C) (Oral)   Resp 20   LMP 06/01/2018   SpO2 95%   Physical Exam Vitals signs and nursing note reviewed.  Constitutional:      General: She is not in acute distress.    Appearance: She is well-developed.  HENT:     Head: Normocephalic and atraumatic.     Jaw: No trismus.     Right Ear: Tympanic membrane, ear canal and external ear normal.     Left Ear: Tympanic membrane, ear canal and external ear normal.     Nose: Congestion present.     Right Sinus: No maxillary sinus tenderness or frontal sinus tenderness.     Left Sinus: No maxillary sinus tenderness or frontal sinus tenderness.     Comments: Sounds audibly congested    Mouth/Throat:     Mouth: Mucous membranes are moist.     Pharynx: Oropharynx is clear. Uvula midline. No pharyngeal swelling, oropharyngeal exudate, posterior oropharyngeal  erythema or uvula swelling.     Tonsils: No tonsillar exudate or tonsillar abscesses. Swelling: 1+ on the right. 1+ on the left.     Comments: Phonating well, tolerating secretions without difficulty Eyes:     General:        Right eye: No discharge.        Left eye: No discharge.     Extraocular Movements: Extraocular movements intact.     Conjunctiva/sclera: Conjunctivae normal.     Pupils:  Pupils are equal, round, and reactive to light.  Neck:     Musculoskeletal: Normal range of motion and neck supple. No neck rigidity or muscular tenderness.     Vascular: No JVD.     Trachea: No tracheal deviation.  Cardiovascular:     Rate and Rhythm: Normal rate and regular rhythm.     Heart sounds: Normal heart sounds.  Pulmonary:     Effort: Pulmonary effort is normal. No respiratory distress.     Breath sounds: Normal breath sounds. No stridor. No wheezing, rhonchi or rales.  Chest:     Chest wall: No tenderness.  Abdominal:     General: Abdomen is flat. There is no distension.     Tenderness: There is no abdominal tenderness. There is no guarding or rebound.  Lymphadenopathy:     Cervical: No cervical adenopathy.  Skin:    General: Skin is warm and dry.     Findings: No erythema.  Neurological:     Mental Status: She is alert.  Psychiatric:        Behavior: Behavior normal.      ED Treatments / Results  Labs (all labs ordered are listed, but only abnormal results are displayed) Labs Reviewed - No data to display  EKG None  Radiology Dg Chest 2 View  Result Date: 06/18/2018 CLINICAL DATA:  Cough and congestion with shortness of breath EXAM: CHEST - 2 VIEW COMPARISON:  May 21, 2018 FINDINGS: Lungs are clear. Heart size and pulmonary vascularity are normal. No adenopathy. No bone lesions. IMPRESSION: No edema or consolidation. Electronically Signed   By: Bretta BangWilliam  Woodruff III M.D.   On: 06/18/2018 08:05    Procedures Procedures (including critical care  time)  Medications Ordered in ED Medications  albuterol (PROVENTIL HFA;VENTOLIN HFA) 108 (90 Base) MCG/ACT inhaler 1 puff (has no administration in time range)     Initial Impression / Assessment and Plan / ED Course  I have reviewed the triage vital signs and the nursing notes.  Pertinent labs & imaging results that were available during my care of the patient were reviewed by me and considered in my medical decision making (see chart for details).     Patient with persistent cough for approximately 1 month.  Reports that she was seen at urgent care last week but records show she was actually seen on 05/21/2018 almost 1 month ago.  Her chest x-ray at that time was negative for pneumonia and she was discharged with treatment for bronchitis but was given a prescription for azithromycin to be filled if her symptoms persisted for an additional 48 to 72 hours.  It appears that she did complete this course of antibiotics.  Here in the ED she is afebrile, vital signs are stable.  She is nontoxic in appearance.  She is tolerating secretions without difficulty.  Chest x-ray shows no signs of pneumonia.  No meningeal signs, nuchal rigidity, or evidence of retropharyngeal or peritonsillar abscess. Patients symptoms are consistent with URI, likely viral etiology. Discussed that antibiotics are not indicated for viral infections. Pt will be discharged with symptomatic treatment.  We will refill her albuterol inhaler.  Recommend follow-up with PCP if symptoms persist.  Discussed strict ED return precautions.  Pt verbalized understanding of and agreement with plan and is safe for discharge home at this time.   Final Clinical Impressions(s) / ED Diagnoses   Final diagnoses:  Viral URI with cough    ED Discharge Orders  Ordered    fluticasone (FLONASE) 50 MCG/ACT nasal spray  Daily     06/18/18 0826    cetirizine (ZYRTEC ALLERGY) 10 MG tablet  Daily     06/18/18 0826    benzonatate (TESSALON) 100  MG capsule  3 times daily PRN     06/18/18 0826           Jeanie Sewer, PA-C 06/18/18 1610    Wynetta Fines, MD 06/23/18 [phone]

## 2018-10-25 ENCOUNTER — Emergency Department (HOSPITAL_COMMUNITY)
Admission: EM | Admit: 2018-10-25 | Discharge: 2018-10-25 | Disposition: A | Discharge status: Home / Self Care | Payer: Medicaid Other | Attending: Emergency Medicine | Admitting: Emergency Medicine

## 2018-10-25 ENCOUNTER — Other Ambulatory Visit: Payer: Self-pay

## 2018-10-25 ENCOUNTER — Emergency Department (HOSPITAL_COMMUNITY): Payer: Medicaid Other

## 2018-10-25 ENCOUNTER — Encounter (HOSPITAL_COMMUNITY): Payer: Self-pay | Admitting: Emergency Medicine

## 2018-10-25 DIAGNOSIS — S63601A Unspecified sprain of right thumb, initial encounter: Secondary | ICD-10-CM | POA: Insufficient documentation

## 2018-10-25 DIAGNOSIS — Y999 Unspecified external cause status: Secondary | ICD-10-CM | POA: Insufficient documentation

## 2018-10-25 DIAGNOSIS — Z79899 Other long term (current) drug therapy: Secondary | ICD-10-CM | POA: Insufficient documentation

## 2018-10-25 DIAGNOSIS — W1830XA Fall on same level, unspecified, initial encounter: Secondary | ICD-10-CM | POA: Insufficient documentation

## 2018-10-25 DIAGNOSIS — F319 Bipolar disorder, unspecified: Secondary | ICD-10-CM | POA: Insufficient documentation

## 2018-10-25 DIAGNOSIS — Z87891 Personal history of nicotine dependence: Secondary | ICD-10-CM | POA: Insufficient documentation

## 2018-10-25 DIAGNOSIS — Y939 Activity, unspecified: Secondary | ICD-10-CM | POA: Insufficient documentation

## 2018-10-25 DIAGNOSIS — Y929 Unspecified place or not applicable: Secondary | ICD-10-CM | POA: Insufficient documentation

## 2018-10-25 DIAGNOSIS — S63501A Unspecified sprain of right wrist, initial encounter: Secondary | ICD-10-CM

## 2018-10-25 NOTE — ED Triage Notes (Signed)
Pt reports she fell yesterday and landed on her R hand. Pt reports her R thumb was dislocated after she fell and she pulled it back into place. Pt reports pain to her thumb radiating to her wrist. Pt reports swelling yesterday but used ice and swelling has decreased. Pt reports she worked yesterday and that worsened the pain.

## 2018-10-25 NOTE — Discharge Instructions (Signed)
You can take Tylenol or Ibuprofen as directed for pain. You can alternate Tylenol and Ibuprofen every 4 hours. If you take Tylenol at 1pm, then you can take Ibuprofen at 5pm. Then you can take Tylenol again at 9pm.   Follow the RICE (Rest, Ice, Compression, Elevation) protocol as directed.   Wear splint for support and stabilization.  As we discussed, please follow-up with referred hand doctor.  Return to the emergency department for any worsening pain, redness or swelling, numbness/weakness.

## 2018-10-25 NOTE — ED Provider Notes (Signed)
[facility] EMERGENCY DEPARTMENT Provider Note   CSN: [id number] Arrival date & time: 10/25/18  1518    History   Chief Complaint Chief Complaint  Patient presents with  . Hand Injury    HPI Meghan Salazar is a 33 y.o. female who presents for evaluation of right palm, right hand pain after mechanical fall yesterday.  Patient states that she was walking with her mom carrying groceries in the house when she tripped and fell.  She thinks she landed vertically on her thumb.  She states that after the fall, she felt like her thumb was dislocated because it appeared to be out of place.  She reports that she was able to put it back in place herself.  She reports that she had swelling, pain associated with the injury last night.  She applied ice with help with swelling.  Reports that she did go to work last night but reported worsening pain when attempting to work.  She states that pain is worsened with movement.  Does have some weakness associated with the from and feels like it is harder to move it.  She does endorse significant pain when attempting to move the thumb.  She has not had any numbness.     The history is provided by the patient.    Past Medical History:  Diagnosis Date  . Abnormal Pap smear    biopsy  . Anxiety   . Asthma   . Bipolar 1 disorder ([place])   . Infection    uti  . Other and unspecified ovarian cysts   . PTSD (post-traumatic stress disorder)     Patient Active Problem List   Diagnosis Date Noted  . Consultation for female sterilization 05/28/2013  . Bacterial vaginosis 03/03/2013  . Short cervix affecting pregnancy 03/03/2013  . Insufficient prenatal care in second trimester 03/03/2013  . Pyelonephritis complicating pregnancy in second trimester, antepartum 12/13/2012    Past Surgical History:  Procedure Laterality Date  . LAPAROSCOPIC BILATERAL SALPINGECTOMY Bilateral 05/28/2013   Procedure: LAPAROSCOPIC BILATERAL SALPINGECTOMY;   Surgeon: Emily Filbert, MD;  Location: [facility];  Service: Gynecology;  Laterality: Bilateral;  . NO PAST SURGERIES       OB History    Gravida  3   Para  3   Term  2   Preterm  1   AB      Living  3     SAB      TAB      Ectopic      Multiple      Live Births  3            Home Medications    Prior to Admission medications   Medication Sig Start Date End Date Taking? Authorizing Provider  albuterol (PROVENTIL HFA;VENTOLIN HFA) 108 (90 Base) MCG/ACT inhaler Inhale 1-2 puffs into the lungs every 6 (six) hours as needed for wheezing or shortness of breath. 05/21/18   Loura Halt A, NP  azithromycin (ZITHROMAX) 250 MG tablet Take 1 tablet (250 mg total) by mouth daily. Take first 2 tablets together, then 1 every day until finished. 05/21/18   Loura Halt A, NP  benzonatate (TESSALON) 100 MG capsule Take 1 capsule (100 mg total) by mouth 3 (three) times daily as needed for cough. 06/18/18   Nils Flack, Mina A, PA-C  cetirizine (ZYRTEC ALLERGY) 10 MG tablet Take 1 tablet (10 mg total) by mouth daily. 06/18/18   Nils Flack, Mina A, PA-C  cyclobenzaprine (FLEXERIL)  10 MG tablet Take 1 tablet (10 mg total) by mouth 2 (two) times daily as needed for muscle spasms. 05/26/16   Kirichenko, Tatyana, PA-C  fluticasone (FLONASE) 50 MCG/ACT nasal spray Place 2 sprays into both nostrils daily. 06/18/18   Luevenia MaxinFawze, Mina A, PA-C  guaiFENesin (MUCINEX) 600 MG 12 hr tablet Take 1-2 tablets (600-1,200 mg total) by mouth 2 (two) times daily as needed for cough or to loosen phlegm. Take with large glass of water 05/21/18   Dahlia ByesBast, Traci A, NP  ibuprofen (ADVIL,MOTRIN) 600 MG tablet Take 1 tablet (600 mg total) by mouth every 6 (six) hours as needed. 09/20/15   Barrett HenleNadeau, Nicole Elizabeth, PA-C  lamoTRIgine (LAMICTAL) 25 MG tablet Take 25 mg by mouth 2 (two) times daily.    [provider]  mirtazapine (REMERON) 15 MG tablet Take 15 mg by mouth at bedtime.    [provider]  naproxen (NAPROSYN) 500 MG tablet  Take 1 tablet (500 mg total) by mouth 2 (two) times daily. 05/26/16   Jaynie CrumbleKirichenko, Tatyana, PA-C    Family History Family History  Problem Relation Age of Onset  . Diabetes Maternal Grandmother   . Hypertension Paternal Grandmother   . Diabetes Paternal Grandmother     Social History Social History   Tobacco Use  . Smoking status: Former Smoker    Packs/day: 0.25    Types: Cigarettes    Quit date: 02/11/2015    Years since quitting: 3.7  . Smokeless tobacco: Never Used  Substance Use Topics  . Alcohol use: Yes    Comment: occasional wine cooler  . Drug use: No     Allergies   Patient has no known allergies.   Review of Systems Review of Systems  Musculoskeletal:       Right hand pain and thumb pain  Neurological: Positive for weakness. Negative for numbness.  All other systems reviewed and are negative.    Physical Exam Updated Vital Signs BP 117/90 (BP Location: Left Arm)   Pulse (!) 59   Temp 98.4 F (36.9 C) (Oral)   Resp 14   LMP 10/23/2018   SpO2 100%   Physical Exam Vitals signs and nursing note reviewed.  Constitutional:      Appearance: She is well-developed.  HENT:     Head: Normocephalic and atraumatic.  Eyes:     General: No scleral icterus.       Right eye: No discharge.        Left eye: No discharge.     Conjunctiva/sclera: Conjunctivae normal.  Cardiovascular:     Pulses:          Radial pulses are 2+ on the right side and 2+ on the left side.  Pulmonary:     Effort: Pulmonary effort is normal.  Musculoskeletal:     Comments: Tenderness palpation noted to the right thumb at the IP that extends distally down into the base and in the radial aspect of the hand and wrist.  Pain with radial deviation of thumb and wrist.  No pain with ulnar deviation.  Flexion/extension of the IP joint of thumb intact when held in isolation.  No deformity or crepitus noted.  She has some mild soft tissue swelling noted around the thenar eminence.   Flexion/extension of the thumb is intact but with subjective reports of pain.  Abduction and abduction of the intact.  She can achieve opposition but has difficulty maintaining it against resistance.  Positive snuffbox tenderness.  Tenderness palpation noted to  forearm, elbow.  No tenderness palpation in left upper extremity.  Skin:    General: Skin is warm and dry.     Capillary Refill: Capillary refill takes less than 2 seconds.     Comments: Good distal cap refill. RUE is not dusky in appearance or cool to touch.  Neurological:     Mental Status: She is alert.     Comments: Sensation intact along major nerve distributions of BUE  Psychiatric:        Speech: Speech normal.        Behavior: Behavior normal.      ED Treatments / Results  Labs (all labs ordered are listed, but only abnormal results are displayed) Labs Reviewed - No data to display  EKG None  Radiology Dg Wrist Complete Right  Result Date: 10/25/2018 CLINICAL DATA:  Right hand and wrist pain after a fall yesterday. Bruising about the right thumb. Initial encounter. EXAM: RIGHT WRIST - COMPLETE 3+ VIEW COMPARISON:  None. FINDINGS: There is no evidence of fracture or dislocation. There is no evidence of arthropathy or other focal bone abnormality. Soft tissues are unremarkable. IMPRESSION: Normal exam. Electronically Signed   By: Drusilla Kannerhomas  Dalessio M.D.   On: 10/25/2018 16:00   Dg Hand Complete Right  Result Date: 10/25/2018 CLINICAL DATA:  Right hand and wrist pain after a fall yesterday. Bruising about the right thumb. Initial encounter. EXAM: RIGHT HAND - COMPLETE 3+ VIEW COMPARISON:  None. FINDINGS: There is no evidence of fracture or dislocation. There is no evidence of arthropathy or other focal bone abnormality. Soft tissues are unremarkable. IMPRESSION: Normal exam. Electronically Signed   By: Drusilla Kannerhomas  Dalessio M.D.   On: 10/25/2018 16:01    Procedures Procedures (including critical care time)  Medications  Ordered in ED Medications - No data to display   Initial Impression / Assessment and Plan / ED Course  I have reviewed the triage vital signs and the nursing notes.  Pertinent labs & imaging results that were available during my care of the patient were reviewed by me and considered in my medical decision making (see chart for details).        33 year old female who presents for evaluation of right hand pain after mechanical fall that occurred yesterday.  Reports that it appeared to be dislocated but she put it back in place.  Since then has had pain, swelling.  Does report some difficulty moving secondary to pain.  No numbness. Patient is afebrile, non-toxic appearing, sitting comfortably on examination table. Vital signs reviewed and stable. Patient is neurovascularly intact.  On exam, she has tenderness to palpation of the left thumb and radial aspect of the hand and wrist.  She does exhibit some snuffbox tenderness.  She has mild soft tissue swelling noted at the base of the thumb and into the thenar eminence.  Concern for fracture versus dislocation versus sprain.  X-rays ordered at triage.  X-rays reviewed.  No evidence of fracture or dislocation on both the hand and wrist.  I discussed results with patient.  Given snuffbox tenderness, will plan to put on thumb spica although I suspect that her pain is most likely related to thumb sprain/wrist sprain.  We will plan to give her follow-up with hand. At this time, patient exhibits no emergent life-threatening condition that require further evaluation in ED or admission. Patient had ample opportunity for questions and discussion. All patient's questions were answered with full understanding. Strict return precautions discussed. Patient expresses understanding and agreement to  plan.   Portions of this note were generated with Dragon dictation software. Dictation errors may occur despite best attempts at proofreading.   Final Clinical  Impressions(s) / ED Diagnoses   Final diagnoses:  Sprain of right wrist, initial encounter  Sprain of right thumb, unspecified site of finger, initial encounter    ED Discharge Orders    None       Rosana HoesLayden, Lindsey A, PA-C 10/25/18 1851    Virgina Norfolkuratolo, Adam, DO 10/25/18 1932

## 2018-10-25 NOTE — Progress Notes (Signed)
Orthopedic Tech Progress Note Patient Details:  Meghan Salazar 11/30/1985 [id number]  Ortho Devices Type of Ortho Device: Thumb velcro splint Ortho Device/Splint Location: right Ortho Device/Splint Interventions: Application   Post Interventions Patient Tolerated: Well Instructions Provided: Care of device   Meghan Salazar 10/25/2018, 4:56 PM  

## 2018-10-25 NOTE — Progress Notes (Signed)
Orthopedic Tech Progress Note Patient Details:  Meghan Salazar 1985/11/05 [id number]  Ortho Devices Type of Ortho Device: Thumb velcro splint Ortho Device/Splint Location: right Ortho Device/Splint Interventions: Application   Post Interventions Patient Tolerated: Well Instructions Provided: Care of device   [place] Pink 10/25/2018, 4:56 PM

## 2019-01-06 ENCOUNTER — Emergency Department (HOSPITAL_COMMUNITY): Payer: Medicaid Other

## 2019-01-06 ENCOUNTER — Emergency Department (HOSPITAL_COMMUNITY)
Admission: EM | Admit: 2019-01-06 | Discharge: 2019-01-06 | Disposition: A | Discharge status: Home / Self Care | Payer: Medicaid Other | Attending: Emergency Medicine | Admitting: Emergency Medicine

## 2019-01-06 ENCOUNTER — Encounter (HOSPITAL_COMMUNITY): Payer: Self-pay | Admitting: *Deleted

## 2019-01-06 ENCOUNTER — Other Ambulatory Visit: Payer: Self-pay

## 2019-01-06 DIAGNOSIS — J45909 Unspecified asthma, uncomplicated: Secondary | ICD-10-CM | POA: Insufficient documentation

## 2019-01-06 DIAGNOSIS — R6510 Systemic inflammatory response syndrome (SIRS) of non-infectious origin without acute organ dysfunction: Secondary | ICD-10-CM

## 2019-01-06 DIAGNOSIS — Z20828 Contact with and (suspected) exposure to other viral communicable diseases: Secondary | ICD-10-CM | POA: Insufficient documentation

## 2019-01-06 DIAGNOSIS — N730 Acute parametritis and pelvic cellulitis: Secondary | ICD-10-CM

## 2019-01-06 DIAGNOSIS — A419 Sepsis, unspecified organism: Secondary | ICD-10-CM

## 2019-01-06 DIAGNOSIS — F319 Bipolar disorder, unspecified: Secondary | ICD-10-CM

## 2019-01-06 DIAGNOSIS — Z87891 Personal history of nicotine dependence: Secondary | ICD-10-CM | POA: Insufficient documentation

## 2019-01-06 DIAGNOSIS — R112 Nausea with vomiting, unspecified: Secondary | ICD-10-CM

## 2019-01-06 DIAGNOSIS — Z79899 Other long term (current) drug therapy: Secondary | ICD-10-CM | POA: Insufficient documentation

## 2019-01-06 DIAGNOSIS — N898 Other specified noninflammatory disorders of vagina: Secondary | ICD-10-CM | POA: Insufficient documentation

## 2019-01-06 LAB — CBC WITH DIFFERENTIAL/PLATELET
Abs Immature Granulocytes: 0.05 10*3/uL (ref 0.00–0.07)
Basophils Absolute: 0.1 10*3/uL (ref 0.0–0.1)
Basophils Relative: 1 %
Eosinophils Absolute: 0.4 10*3/uL (ref 0.0–0.5)
Eosinophils Relative: 4 %
HCT: 41.1 % (ref 36.0–46.0)
Hemoglobin: 14.2 g/dL (ref 12.0–15.0)
Immature Granulocytes: 0 %
Lymphocytes Relative: 18 %
Lymphs Abs: 2.1 10*3/uL (ref 0.7–4.0)
MCH: 32.6 pg (ref 26.0–34.0)
MCHC: 34.5 g/dL (ref 30.0–36.0)
MCV: 94.3 fL (ref 80.0–100.0)
Monocytes Absolute: 0.4 10*3/uL (ref 0.1–1.0)
Monocytes Relative: 4 %
Neutro Abs: 8.4 10*3/uL — ABNORMAL HIGH (ref 1.7–7.7)
Neutrophils Relative %: 73 %
Platelets: 181 10*3/uL (ref 150–400)
RBC: 4.36 MIL/uL (ref 3.87–5.11)
RDW: 13.2 % (ref 11.5–15.5)
WBC: 11.4 10*3/uL — ABNORMAL HIGH (ref 4.0–10.5)
nRBC: 0 % (ref 0.0–0.2)

## 2019-01-06 LAB — PROTIME-INR
INR: 1.2 (ref 0.8–1.2)
Prothrombin Time: 15 seconds (ref 11.4–15.2)

## 2019-01-06 LAB — URINALYSIS, ROUTINE W REFLEX MICROSCOPIC
Bilirubin Urine: NEGATIVE
Glucose, UA: 50 mg/dL — AB
Hgb urine dipstick: NEGATIVE
Ketones, ur: 20 mg/dL — AB
Leukocytes,Ua: NEGATIVE
Nitrite: NEGATIVE
Protein, ur: NEGATIVE mg/dL
Specific Gravity, Urine: 1.013 (ref 1.005–1.030)
pH: 7 (ref 5.0–8.0)

## 2019-01-06 LAB — LACTIC ACID, PLASMA
Lactic Acid, Venous: 5.5 mmol/L (ref 0.5–1.9)
Lactic Acid, Venous: 2.7 mmol/L (ref 0.5–1.9)
Lactic Acid, Venous: 1.9 mmol/L (ref 0.5–1.9)

## 2019-01-06 LAB — COMPREHENSIVE METABOLIC PANEL
ALT: 18 U/L (ref 0–44)
AST: 21 U/L (ref 15–41)
Albumin: 4.5 g/dL (ref 3.5–5.0)
Alkaline Phosphatase: 49 U/L (ref 38–126)
Anion gap: 18 — ABNORMAL HIGH (ref 5–15)
BUN: 6 mg/dL (ref 6–20)
CO2: 16 mmol/L — ABNORMAL LOW (ref 22–32)
Calcium: 9.6 mg/dL (ref 8.9–10.3)
Chloride: 104 mmol/L (ref 98–111)
Creatinine, Ser: 0.94 mg/dL (ref 0.44–1.00)
GFR calc Af Amer: >60 mL/min (ref >60)
GFR calc non Af Amer: >60 mL/min (ref >60)
Glucose, Bld: 136 mg/dL — ABNORMAL HIGH (ref 70–99)
Potassium: 3.6 mmol/L (ref 3.5–5.1)
Sodium: 138 mmol/L (ref 135–145)
Total Bilirubin: 0.8 mg/dL (ref 0.3–1.2)
Total Protein: 7.1 g/dL (ref 6.5–8.1)

## 2019-01-06 LAB — RAPID URINE DRUG SCREEN, HOSP PERFORMED
Amphetamines: NOT DETECTED
Barbiturates: NOT DETECTED
Benzodiazepines: NOT DETECTED
Cocaine: NOT DETECTED
Opiates: NOT DETECTED
Tetrahydrocannabinol: POSITIVE — AB

## 2019-01-06 LAB — WET PREP, GENITAL
Sperm: NONE SEEN
Trich, Wet Prep: NONE SEEN
Yeast Wet Prep HPF POC: NONE SEEN

## 2019-01-06 LAB — I-STAT BETA HCG BLOOD, ED (MC, WL, AP ONLY): I-stat hCG, quantitative: <5 m[IU]/mL (ref <5)

## 2019-01-06 LAB — SARS CORONAVIRUS 2 (TAT 6-24 HRS): SARS Coronavirus 2: NEGATIVE

## 2019-01-06 LAB — APTT: aPTT: 22 seconds — ABNORMAL LOW (ref 24–36)

## 2019-01-06 MED ORDER — SODIUM CHLORIDE 0.9 % IV SOLN
100.0000 mg | Freq: Once | INTRAVENOUS | Status: AC
  Administered 2019-01-06: 100 mg via INTRAVENOUS
  Filled 2019-01-06: qty 100

## 2019-01-06 MED ORDER — SODIUM CHLORIDE 0.9 % IV BOLUS
1000.0000 mL | Freq: Once | INTRAVENOUS | Status: AC
  Administered 2019-01-06: 15:00:00 1000 mL via INTRAVENOUS

## 2019-01-06 MED ORDER — SODIUM CHLORIDE 0.9 % IV BOLUS (SEPSIS)
1000.0000 mL | Freq: Once | INTRAVENOUS | Status: AC
  Administered 2019-01-06: 1000 mL via INTRAVENOUS

## 2019-01-06 MED ORDER — DOXYCYCLINE HYCLATE 100 MG PO CAPS: 100.0000 mg | ORAL_CAPSULE | Freq: Two times a day (BID) | ORAL | 0 refills | Status: DC

## 2019-01-06 MED ORDER — SODIUM CHLORIDE 0.9 % IV BOLUS
1000.0000 mL | Freq: Once | INTRAVENOUS | Status: AC
  Administered 2019-01-06: 1000 mL via INTRAVENOUS

## 2019-01-06 MED ORDER — DROPERIDOL 2.5 MG/ML IJ SOLN
2.5000 mg | Freq: Once | INTRAMUSCULAR | Status: AC
  Administered 2019-01-06: 2.5 mg via INTRAVENOUS
  Filled 2019-01-06: qty 2

## 2019-01-06 MED ORDER — HYDRALAZINE HCL 20 MG/ML IJ SOLN: 10.0000 mg | INTRAMUSCULAR | Status: DC | PRN

## 2019-01-06 MED ORDER — PROMETHAZINE HCL 25 MG/ML IJ SOLN
12.5000 mg | Freq: Once | INTRAMUSCULAR | Status: AC
  Administered 2019-01-06: 12.5 mg via INTRAVENOUS
  Filled 2019-01-06: qty 1

## 2019-01-06 MED ORDER — SODIUM CHLORIDE 0.9 % IV SOLN
2.0000 g | Freq: Once | INTRAVENOUS | Status: AC
  Administered 2019-01-06: 2 g via INTRAVENOUS
  Filled 2019-01-06: qty 20

## 2019-01-06 MED ORDER — ONDANSETRON 4 MG PO TBDP: 4.0000 mg | ORAL_TABLET | Freq: Three times a day (TID) | ORAL | 0 refills | Status: DC | PRN

## 2019-01-06 MED ORDER — ONDANSETRON HCL 4 MG/2ML IJ SOLN
4.0000 mg | Freq: Four times a day (QID) | INTRAMUSCULAR | Status: DC | PRN
  Administered 2019-01-06: 4 mg via INTRAVENOUS
  Filled 2019-01-06: qty 2

## 2019-01-06 MED ORDER — PROMETHAZINE HCL 25 MG RE SUPP: 25.0000 mg | Freq: Four times a day (QID) | RECTAL | 0 refills | Status: DC | PRN

## 2019-01-06 MED ORDER — SODIUM CHLORIDE 0.9 % IV BOLUS (SEPSIS)
500.0000 mL | Freq: Once | INTRAVENOUS | Status: AC
  Administered 2019-01-06: 11:00:00 500 mL via INTRAVENOUS

## 2019-01-06 MED ORDER — IOHEXOL 300 MG/ML  SOLN
100.0000 mL | Freq: Once | INTRAMUSCULAR | Status: AC | PRN
  Administered 2019-01-06: 11:00:00 100 mL via INTRAVENOUS

## 2019-01-06 MED ORDER — MORPHINE SULFATE (PF) 2 MG/ML IV SOLN
2.0000 mg | INTRAVENOUS | Status: DC | PRN
  Administered 2019-01-06: 15:00:00 2 mg via INTRAVENOUS
  Filled 2019-01-06: qty 1

## 2019-01-06 NOTE — ED Provider Notes (Signed)
I received this patient in signout from Dr. Jeanell Sparrow.  At time of signout, she had been evaluated for abdominal pain and intractable nausea and vomiting.  Her CT scan was negative for acute process, pelvic exam was not overwhelmingly concerning for PID however given suprapubic pain, patient had received treatment for PID.  [facility] hospitalist, Dr. Tamala Julian, was consulted and evaluated the patient at bedside.  He spoke with GYN who recommended 100 mg doxycycline twice daily x 10d and IVF/supportive measures for vomiting. Dr. Tamala Julian declined admission. Pt remained nauseated on my exam, gave droperidol. She was later able to void and drank gingerale. She felt better on 2 reassessments. Lactate normalized. I discussed treatment course w/ doxycycline and antiemetics, f/u with GYN.  I extensively reviewed return precautions with her including any worsening symptoms or new symptoms such as fever or heavy bleeding.  She voiced understanding.   Little, Wenda Overland, MD 01/06/19 2026

## 2019-01-06 NOTE — ED Triage Notes (Signed)
PT reports ABD pain woke her up this AM. Pt reports N/V and ABD pain LT/RT  side ABD. Per EMS pt given fen t. 100 mcg IV and 4 mg Zofran. EMS BP 1660/80 , HR y60, O2 % 100 RA

## 2019-01-06 NOTE — Consult Note (Addendum)
Medical Consultation   Meghan Salazar  WGN:[id number]  DOB: 08/05/1985  DOA: 01/06/2019  PCP: Patient, No Pcp Per    Requesting physician: Hipolito Bayleyaniel Ray, MD  Reason for consultation: Lower abdominal pain with nausea and vomiting   History of Present Illness: Meghan Salazar is an 33 y.o. female with pmh bipolar disorder, anxiety, PTSD, and UTI; who presents with complaints of sharp lower abdominal pain starting last night.  Pain is severe now.  She initially thought symptoms were caused by the start of her cycle.  Denies having any fever, headache, chest pain, cough, shortness of breath, or diarrhea symptoms.  She admits to having some vaginal discharge, but reports feeling a benign risk relationship with only 1 sexual partner.  Patient notes similar pain when she had a ruptured cyst on her ovaries at the age of 33.  Associated symptoms include nausea and vomiting.  Emesis noted to be nonbloody in appearance.  Labs revealed WBC 11.4, lactic acid 5.5, and urine pregnancy screen negative.  Urinalysis showed no significant signs of infection.  Wet prep revealed positive clue cells and few WBCs.  There was reports of tenderness on bimanual exam.  CT scan of the abdomen and pelvis showed no abnormalities.  Patient was given 2.5 L of normal saline IV fluids, antiemetics, pain medication, doxycycline, and 2 g of Rocephin IV.   Review of Systems:  As per HPI otherwise 10 point review of systems negative.    Past Medical History: Past Medical History:  Diagnosis Date  . Abnormal Pap smear    biopsy  . Anxiety   . Asthma   . Bipolar 1 disorder (HCC)   . Infection    uti  . Other and unspecified ovarian cysts   . PTSD (post-traumatic stress disorder)     Past Surgical History: Past Surgical History:  Procedure Laterality Date  . LAPAROSCOPIC BILATERAL SALPINGECTOMY Bilateral 05/28/2013   Procedure: LAPAROSCOPIC BILATERAL SALPINGECTOMY;  Surgeon: Allie BossierMyra C Dove, MD;  Location: [facility];   Service: Gynecology;  Laterality: Bilateral;  . NO PAST SURGERIES       Allergies:  No Known Allergies   Social History:  reports that she quit smoking about 3 years ago. Her smoking use included cigarettes. She smoked 0.25 packs per day. She has never used smokeless tobacco. She reports current alcohol use. She reports that she does not use drugs.   Family History: Family History  Problem Relation Age of Onset  . Diabetes Maternal Grandmother   . Hypertension Paternal Grandmother   . Diabetes Paternal Grandmother      Physical Exam: Vitals:   01/06/19 0900 01/06/19 0915 01/06/19 0916 01/06/19 0930  BP: (!) 147/100 (!) 160/111  (!) 165/96  Pulse:  (!) 53  (!) 58  Resp: 20 13  18   Temp:   (!) 94.7 F (34.8 C)   TempSrc:   Rectal   SpO2:  100%  (!) 89%    Constitutional: women alert and appears to be in some discomfort with emesis bag at bedside Eyes: PERLA, EOMI, irises appear normal, anicteric sclera,  ENMT: external ears and nose appear normal. Lips appears normal, oropharynx mucosa, tongue, posterior pharynx appear normal  Neck: neck appears normal, no masses, normal ROM, no thyromegaly, no JVD  CVS: S1-S2 clear, no murmur rubs or gallops, no LE edema, normal pedal pulses  Respiratory:  clear to auscultation bilaterally, no wheezing, rales or rhonchi. Respiratory  effort normal. No accessory muscle use.  Abdomen: Lower abdomen tender, nondistended, normal bowel sounds, no hepatosplenomegaly Musculoskeletal: : no cyanosis, clubbing or edema noted bilaterally Neuro: Cranial nerves II-XII intact, strength, sensation, reflexes Psych: judgement and insight appear normal, stable mood and affect, mental status Skin: no rashes or lesions or ulcers, no induration or nodules   (  Data reviewed:  I have personally reviewed following labs and imaging studies Labs:  CBC: Recent Labs  Lab 01/06/19 0916  WBC 11.4*  NEUTROABS 8.4*  HGB 14.2  HCT 41.1  MCV 94.3  PLT 181     Basic Metabolic Panel: Recent Labs  Lab 01/06/19 0916  NA 138  K 3.6  CL 104  CO2 16*  GLUCOSE 136*  BUN 6  CREATININE 0.94  CALCIUM 9.6   GFR CrCl cannot be calculated (Unknown ideal weight.). Liver Function Tests: Recent Labs  Lab 01/06/19 0916  AST 21  ALT 18  ALKPHOS 49  BILITOT 0.8  PROT 7.1  ALBUMIN 4.5   No results for input(s): LIPASE, AMYLASE in the last 168 hours. No results for input(s): AMMONIA in the last 168 hours. Coagulation profile Recent Labs  Lab 01/06/19 0916  INR 1.2    Cardiac Enzymes: No results for input(s): CKTOTAL, CKMB, CKMBINDEX, TROPONINI in the last 168 hours. BNP: Invalid input(s): POCBNP CBG: No results for input(s): GLUCAP in the last 168 hours. D-Dimer No results for input(s): DDIMER in the last 72 hours. Hgb A1c No results for input(s): HGBA1C in the last 72 hours. Lipid Profile No results for input(s): CHOL, HDL, LDLCALC, TRIG, CHOLHDL, LDLDIRECT in the last 72 hours. Thyroid function studies No results for input(s): TSH, T4TOTAL, T3FREE, THYROIDAB in the last 72 hours.  Invalid input(s): FREET3 Anemia work up No results for input(s): VITAMINB12, FOLATE, FERRITIN, TIBC, IRON, RETICCTPCT in the last 72 hours. Urinalysis    Component Value Date/Time   COLORURINE STRAW (A) 01/06/2019 0947   APPEARANCEUR CLEAR 01/06/2019 0947   LABSPEC 1.013 01/06/2019 0947   PHURINE 7.0 01/06/2019 0947   GLUCOSEU 50 (A) 01/06/2019 0947   HGBUR NEGATIVE 01/06/2019 0947   BILIRUBINUR NEGATIVE 01/06/2019 0947   KETONESUR 20 (A) 01/06/2019 0947   PROTEINUR NEGATIVE 01/06/2019 0947   UROBILINOGEN 0.2 08/20/2016 1451   NITRITE NEGATIVE 01/06/2019 0947   LEUKOCYTESUR NEGATIVE 01/06/2019 0947     Microbiology Recent Results (from the past 240 hour(s))  Wet prep, genital     Status: Abnormal   Collection Time: 01/06/19  9:20 AM   Specimen: Cervix; Genital  Result Value Ref Range Status   Yeast Wet Prep HPF POC NONE SEEN NONE SEEN  Final    Comment: Specimen diluted due to transport tube containing more than 1 ml of saline, interpret results with caution.   Trich, Wet Prep NONE SEEN NONE SEEN Final   Clue Cells Wet Prep HPF POC PRESENT (A) NONE SEEN Final   WBC, Wet Prep HPF POC FEW (A) NONE SEEN Final   Sperm NONE SEEN  Final    Comment: Performed at [facility], [address] [place], [place] [address]       Inpatient Medications:   Scheduled Meds: . promethazine  12.5 mg Intravenous Once   Continuous Infusions: . doxycycline (VIBRAMYCIN) IV       Radiological Exams on Admission: Ct Abdomen Pelvis W Contrast  Result Date: 01/06/2019 CLINICAL DATA:  Generalized abdominal pain with nausea vomiting. EXAM: CT ABDOMEN AND PELVIS WITH CONTRAST TECHNIQUE: Multidetector CT  imaging of the abdomen and pelvis was performed using the standard protocol following bolus administration of intravenous contrast. CONTRAST:  11mL OMNIPAQUE IOHEXOL 300 MG/ML  SOLN COMPARISON:  None. FINDINGS: Lower chest: Unremarkable. Hepatobiliary: No suspicious focal abnormality within the liver parenchyma. There is no evidence for gallstones, gallbladder wall thickening, or pericholecystic fluid. No intrahepatic or extrahepatic biliary dilation. Pancreas: No focal mass lesion. No dilatation of the main duct. No intraparenchymal cyst. No peripancreatic edema. Spleen: No splenomegaly. No focal mass lesion. Adrenals/Urinary Tract: No adrenal nodule or mass. Kidneys unremarkable. No evidence for hydroureter. The urinary bladder appears normal for the degree of distention. Stomach/Bowel: Stomach is unremarkable. No gastric wall thickening. No evidence of outlet obstruction. Duodenum is normally positioned as is the ligament of Treitz. No small bowel wall thickening. No small bowel dilatation. The terminal ileum is normal. The appendix is normal. No gross colonic mass. No colonic wall thickening. Vascular/Lymphatic: No abdominal aortic aneurysm.  No abdominal aortic atherosclerotic calcification. There is no gastrohepatic or hepatoduodenal ligament lymphadenopathy. No intraperitoneal or retroperitoneal lymphadenopathy. No pelvic sidewall lymphadenopathy. Reproductive: The uterus is unremarkable.  There is no adnexal mass. Other: No intraperitoneal free fluid. Musculoskeletal: No worrisome lytic or sclerotic osseous abnormality. IMPRESSION: No acute findings in the abdomen or pelvis. Specifically, no findings to explain the patient's history of abdominal pain with nausea and vomiting. Electronically Signed   By: Misty Stanley M.D.   On: 01/06/2019 11:21   Dg Chest Port 1 View  Result Date: 01/06/2019 CLINICAL DATA:  Abdominal pain and hypothermia. EXAM: PORTABLE CHEST 1 VIEW COMPARISON:  06/18/2018 FINDINGS: The heart size and mediastinal contours are within normal limits. There is no evidence of pulmonary edema, consolidation, pneumothorax, nodule or pleural fluid. The visualized skeletal structures are unremarkable. IMPRESSION: No active disease. Electronically Signed   By: Aletta Edouard M.D.   On: 01/06/2019 10:08    Impression/Recommendations SIRS/sepsis secondary to PID with bacterial vaginosis: Acute.  Patient found to be afebrile with WBC 11.3 and lactic acid elevated up to 5.5.  Tenderness noted on pelvic bimanual  examination.  Wet prep positive for clue cells.  No acute abnormalities noted on CT scan of the abdomen pelvis.  Patient treated with Rocephin and doxycycline suspecting PID.  Case was discussed with OB/GYN who recommended 10-day course of doxycycline and follow-up in outpatient setting. -Continue IV fluids -Rechecking lactic acid -Pain management -If patient requiring admission gynecology should be called  Nausea and vomiting: Acute.  Suspect secondary to above. -Antiemetics as needed -Advance diet as tolerated  Essential hypertension: Blood pressures elevated up to 188/118.  In the acute setting pain with nausea and  vomiting suspect that these may be falsely elevated. -Hydralazine IV as needed for elevated blood pressure  Bipolar disorder -Would continue current home medication regimen   Thank you for this consultation.  Our [facility] hospitalist team will follow the patient with you.   Time Spent: 30 mins  Norval Morton M.D. [facility] 01/06/2019, 12:44 PM

## 2019-01-06 NOTE — ED Triage Notes (Signed)
PT on [facility] for BM

## 2019-01-06 NOTE — ED Provider Notes (Addendum)
[facility] EMERGENCY DEPARTMENT Provider Note   CSN: [id number] Arrival date & time: 01/06/19  3976     History   Chief Complaint Chief Complaint  Patient presents with  . Nausea  . Emesis  . Abdominal Pain    HPI Meghan Salazar is a 33 y.o. female.     HPI  33 year old female presents today via EMS with reports that she had onset of lower abdominal pain this morning.  She reports left sided lower abdominal pain that woke her from sleep "before it was light".  She associated nausea and vomiting.  She called EMS and was given fentanyl 100 mics and Zofran and route.  She states that pain is currently 7 out of 10.  She denies any headache, head injury, chest pain, cough, nasal congestion, sore throat, or diarrhea.  She denies any COVID exposures.  She states that she has had her tubes tied.  States last menstrual period was 2 weeks ago at a normal time.  She endorses some vaginal discharge.  She had an episode of a ruptured cyst on her ovary when she was 19.  She feels that the pain is somewhat like that.  It is sharp in nature.  Is relieved somewhat by laying back.  It was somewhat improved with the fentanyl.  Emesis has been nonbloody and nonbilious.  She has had multiple episodes of vomiting.  Past Medical History:  Diagnosis Date  . Abnormal Pap smear    biopsy  . Anxiety   . Asthma   . Bipolar 1 disorder ([place])   . Infection    uti  . Other and unspecified ovarian cysts   . PTSD (post-traumatic stress disorder)     Patient Active Problem List   Diagnosis Date Noted  . Consultation for female sterilization 05/28/2013  . Bacterial vaginosis 03/03/2013  . Short cervix affecting pregnancy 03/03/2013  . Insufficient prenatal care in second trimester 03/03/2013  . Pyelonephritis complicating pregnancy in second trimester, antepartum 12/13/2012    Past Surgical History:  Procedure Laterality Date  . LAPAROSCOPIC BILATERAL SALPINGECTOMY Bilateral 05/28/2013    Procedure: LAPAROSCOPIC BILATERAL SALPINGECTOMY;  Surgeon: Emily Filbert, MD;  Location: [facility];  Service: Gynecology;  Laterality: Bilateral;  . NO PAST SURGERIES       OB History    Gravida  3   Para  3   Term  2   Preterm  1   AB      Living  3     SAB      TAB      Ectopic      Multiple      Live Births  3            Home Medications    Prior to Admission medications   Medication Sig Start Date End Date Taking? Authorizing Provider  albuterol (PROVENTIL HFA;VENTOLIN HFA) 108 (90 Base) MCG/ACT inhaler Inhale 1-2 puffs into the lungs every 6 (six) hours as needed for wheezing or shortness of breath. 05/21/18   Loura Halt A, NP  azithromycin (ZITHROMAX) 250 MG tablet Take 1 tablet (250 mg total) by mouth daily. Take first 2 tablets together, then 1 every day until finished. 05/21/18   Loura Halt A, NP  benzonatate (TESSALON) 100 MG capsule Take 1 capsule (100 mg total) by mouth 3 (three) times daily as needed for cough. 06/18/18   Nils Flack, Mina A, PA-C  cetirizine (ZYRTEC ALLERGY) 10 MG tablet Take 1 tablet (10 mg  total) by mouth daily. 06/18/18   Luevenia MaxinFawze, Mina A, PA-C  cyclobenzaprine (FLEXERIL) 10 MG tablet Take 1 tablet (10 mg total) by mouth 2 (two) times daily as needed for muscle spasms. 05/26/16   Kirichenko, Tatyana, PA-C  fluticasone (FLONASE) 50 MCG/ACT nasal spray Place 2 sprays into both nostrils daily. 06/18/18   Luevenia MaxinFawze, Mina A, PA-C  guaiFENesin (MUCINEX) 600 MG 12 hr tablet Take 1-2 tablets (600-1,200 mg total) by mouth 2 (two) times daily as needed for cough or to loosen phlegm. Take with large glass of water 05/21/18   Dahlia ByesBast, Traci A, NP  ibuprofen (ADVIL,MOTRIN) 600 MG tablet Take 1 tablet (600 mg total) by mouth every 6 (six) hours as needed. 09/20/15   Barrett HenleNadeau, Nicole Elizabeth, PA-C  lamoTRIgine (LAMICTAL) 25 MG tablet Take 25 mg by mouth 2 (two) times daily.    [provider]  mirtazapine (REMERON) 15 MG tablet Take 15 mg by mouth at bedtime.    [provider]  naproxen (NAPROSYN) 500 MG tablet Take 1 tablet (500 mg total) by mouth 2 (two) times daily. 05/26/16   Jaynie CrumbleKirichenko, Tatyana, PA-C    Family History Family History  Problem Relation Age of Onset  . Diabetes Maternal Grandmother   . Hypertension Paternal Grandmother   . Diabetes Paternal Grandmother     Social History Social History   Tobacco Use  . Smoking status: Former Smoker    Packs/day: 0.25    Types: Cigarettes    Quit date: 02/11/2015    Years since quitting: 3.9  . Smokeless tobacco: Never Used  Substance Use Topics  . Alcohol use: Yes    Comment: occasional wine cooler  . Drug use: No     Allergies   Patient has no known allergies.   Review of Systems Review of Systems  All other systems reviewed and are negative.    Physical Exam Updated Vital Signs LMP 12/23/2018   Physical Exam Vitals signs and nursing note reviewed. Exam conducted with a chaperone present.  Constitutional:      General: She is in acute distress.     Appearance: She is well-developed. She is obese.  HENT:     Head: Normocephalic.     Mouth/Throat:     Mouth: Mucous membranes are moist.  Eyes:     Extraocular Movements: Extraocular movements intact.  Pulmonary:     Effort: Pulmonary effort is normal.  Abdominal:     General: Abdomen is protuberant.     Palpations: Abdomen is soft.     Tenderness: There is abdominal tenderness in the right lower quadrant and left lower quadrant. There is guarding. Negative signs include Murphy's sign and McBurney's sign.     Hernia: No hernia is present.  Genitourinary:    Vagina: Vaginal discharge and tenderness present. No erythema or bleeding.     Cervix: No discharge or friability.     Uterus: Tender.      Adnexa:        Right: Tenderness present.        Left: Tenderness present.      Rectum: Normal.  Skin:    General: Skin is warm and dry.  Neurological:     General: No focal deficit present.      ED Treatments /  Results  Labs (all labs ordered are listed, but only abnormal results are displayed) Labs Reviewed - No data to display  EKG None  Radiology No results found.  Procedures Procedures (including critical care time)  Medications Ordered in ED Medications - No data to display   Initial Impression / Assessment and Plan / ED Course  I have reviewed the triage vital signs and the nursing notes.  Pertinent labs & imaging results that were available during my care of the patient were reviewed by me and considered in my medical decision making (see chart for details).        Discussed abx with pharmacist.  Fleet Contrasachel will rx rocephin 9:59 AM Labs reviewed- elevated lactic noted Fluid bolus ordered  11:09 AM Vitals:   01/06/19 0916 01/06/19 0930  BP:  (!) 165/96  Pulse:  (!) 58  Resp:  18  Temp: (!) 94.7 F (34.8 C)   SpO2:  (!) 89%   Patient feels improved, but still having some pain and nausea No active vomiting at this tims Awaiting CT results 33 year old female presents today with nausea vomiting and pelvic pain.  On exam her abdomen is tender in the lower abdomen to pelvis.  Pelvic exam reveals Patient with recorded sat 89%, but on my reexam, it is 100%. Patient continues with nausea and vomiting and complaining of dry mouth. Plan admission for intractable nausea and vomiitng. Initial elevated lactic acid with repeat pending,but patient with normal hr and bp Will recheck rectal temp  CRITICAL CARE Performed by: Margarita Grizzleanielle Makhayla Mcmurry Total critical care time: 45 minutes Critical care time was exclusive of separately billable procedures and treating other patients. Critical care was necessary to treat or prevent imminent or life-threatening deterioration. Critical care was time spent personally by me on the following activities: development of treatment plan with patient and/or surrogate as well as nursing, discussions with consultants, evaluation of patient's response to  treatment, examination of patient, obtaining history from patient or surrogate, ordering and performing treatments and interventions, ordering and review of laboratory studies, ordering and review of radiographic studies, pulse oximetry and re-evaluation of patient's condition.  Final Clinical Impressions(s) / ED Diagnoses   Final diagnoses:  None    ED Discharge Orders    None       Margarita Grizzleay, Gurtej Noyola, MD 01/06/19 1529    Margarita Grizzleay, Coco Sharpnack, MD 01/16/19 [phone]

## 2019-01-06 NOTE — ED Triage Notes (Signed)
PT out of b ed and voiding on floor . BSC at bed side for PT .

## 2019-01-07 LAB — URINE CULTURE: Culture: NO GROWTH

## 2019-01-08 LAB — GC/CHLAMYDIA PROBE AMP (~~LOC~~) NOT AT ARMC
Chlamydia: NEGATIVE
Neisseria Gonorrhea: NEGATIVE

## 2019-01-11 LAB — CULTURE, BLOOD (ROUTINE X 2)
Culture: NO GROWTH
Culture: NO GROWTH
Special Requests: ADEQUATE

## 2019-06-27 ENCOUNTER — Encounter (HOSPITAL_COMMUNITY): Payer: Self-pay

## 2019-06-27 ENCOUNTER — Other Ambulatory Visit: Payer: Self-pay

## 2019-06-27 ENCOUNTER — Ambulatory Visit (HOSPITAL_COMMUNITY)
Admission: EM | Admit: 2019-06-27 | Discharge: 2019-06-27 | Disposition: A | Discharge status: Home / Self Care | Payer: HRSA Program | Attending: Internal Medicine | Admitting: Internal Medicine

## 2019-06-27 DIAGNOSIS — F319 Bipolar disorder, unspecified: Secondary | ICD-10-CM | POA: Diagnosis not present

## 2019-06-27 DIAGNOSIS — Z79899 Other long term (current) drug therapy: Secondary | ICD-10-CM | POA: Insufficient documentation

## 2019-06-27 DIAGNOSIS — Z87891 Personal history of nicotine dependence: Secondary | ICD-10-CM | POA: Diagnosis not present

## 2019-06-27 DIAGNOSIS — J45909 Unspecified asthma, uncomplicated: Secondary | ICD-10-CM | POA: Diagnosis not present

## 2019-06-27 DIAGNOSIS — R439 Unspecified disturbances of smell and taste: Secondary | ICD-10-CM | POA: Diagnosis present

## 2019-06-27 DIAGNOSIS — R5383 Other fatigue: Secondary | ICD-10-CM | POA: Insufficient documentation

## 2019-06-27 DIAGNOSIS — F419 Anxiety disorder, unspecified: Secondary | ICD-10-CM | POA: Diagnosis not present

## 2019-06-27 DIAGNOSIS — Z20822 Contact with and (suspected) exposure to covid-19: Secondary | ICD-10-CM | POA: Diagnosis not present

## 2019-06-27 DIAGNOSIS — R432 Parageusia: Secondary | ICD-10-CM

## 2019-06-27 MED ORDER — ALBUTEROL SULFATE HFA 108 (90 BASE) MCG/ACT IN AERS: 1.0000 | INHALATION_SPRAY | Freq: Four times a day (QID) | RESPIRATORY_TRACT | 0 refills | Status: DC | PRN

## 2019-06-27 MED ORDER — BENZONATATE 100 MG PO CAPS: 100.0000 mg | ORAL_CAPSULE | Freq: Three times a day (TID) | ORAL | 0 refills | Status: DC

## 2019-06-27 NOTE — ED Triage Notes (Addendum)
Pt states she lost her sense of taste and smell in 2 days. Pt state she has has headaches  And she hurts all over.

## 2019-06-27 NOTE — ED Provider Notes (Signed)
[facility]    CSN: [id number] Arrival date & time: 06/27/19  1310      History   Chief Complaint Chief Complaint  Patient presents with  . covid test    HPI Meghan Salazar is a 34 y.o. female with history of bipolar comes to urgent care with complaints of generalized malaise, generalized body aches, cough, headaches of 2 days duration.  Patient says symptoms started about 2 days ago and has been persistent.  She admits to having increasing fatigue over the same period of time.  Her appetite is fair.  She has some nausea, vomiting and diarrhea which is currently subsided.  She works in a nursing home.  She gets tested for Covid weekly.  Last test was done on Monday.  Patient became more concerned when she lost her sense of taste this morning.  Hence the visit to the urgent care to be evaluated.   HPI  Past Medical History:  Diagnosis Date  . Abnormal Pap smear    biopsy  . Anxiety   . Asthma   . Bipolar 1 disorder (HCC)   . Infection    uti  . Other and unspecified ovarian cysts   . PTSD (post-traumatic stress disorder)     Patient Active Problem List   Diagnosis Date Noted  . Consultation for female sterilization 05/28/2013  . Bacterial vaginosis 03/03/2013  . Short cervix affecting pregnancy 03/03/2013  . Insufficient prenatal care in second trimester 03/03/2013  . Pyelonephritis complicating pregnancy in second trimester, antepartum 12/13/2012    Past Surgical History:  Procedure Laterality Date  . LAPAROSCOPIC BILATERAL SALPINGECTOMY Bilateral 05/28/2013   Procedure: LAPAROSCOPIC BILATERAL SALPINGECTOMY;  Surgeon: Allie Bossier, MD;  Location: [facility];  Service: Gynecology;  Laterality: Bilateral;  . NO PAST SURGERIES      OB History    Gravida  3   Para  3   Term  2   Preterm  1   AB      Living  3     SAB      TAB      Ectopic      Multiple      Live Births  3            Home Medications    Prior to Admission medications     Medication Sig Start Date End Date Taking? Authorizing Provider  albuterol (VENTOLIN HFA) 108 (90 Base) MCG/ACT inhaler Inhale 1-2 puffs into the lungs every 6 (six) hours as needed for wheezing or shortness of breath. 06/27/19   Amsi Grimley, Britta Mccreedy, MD  benzonatate (TESSALON) 100 MG capsule Take 1 capsule (100 mg total) by mouth every 8 (eight) hours. 06/27/19   Merrilee Jansky, MD  cetirizine (ZYRTEC ALLERGY) 10 MG tablet Take 1 tablet (10 mg total) by mouth daily. 06/18/18   Luevenia Maxin, Mina A, PA-C  cyclobenzaprine (FLEXERIL) 10 MG tablet Take 1 tablet (10 mg total) by mouth 2 (two) times daily as needed for muscle spasms. 05/26/16   Kirichenko, Lemont Fillers, PA-C  doxycycline (VIBRAMYCIN) 100 MG capsule Take 1 capsule (100 mg total) by mouth 2 (two) times daily. 01/06/19   Little, Ambrose Finland, MD  fluticasone (FLONASE) 50 MCG/ACT nasal spray Place 2 sprays into both nostrils daily. 06/18/18   Luevenia Maxin, Mina A, PA-C  guaiFENesin (MUCINEX) 600 MG 12 hr tablet Take 1-2 tablets (600-1,200 mg total) by mouth 2 (two) times daily as needed for cough or to loosen phlegm. Take with large glass  of water 05/21/18   Dahlia Byes A, NP  ibuprofen (ADVIL,MOTRIN) 600 MG tablet Take 1 tablet (600 mg total) by mouth every 6 (six) hours as needed. 09/20/15   Barrett Henle, PA-C  lamoTRIgine (LAMICTAL) 25 MG tablet Take 25 mg by mouth 2 (two) times daily.    [provider]  mirtazapine (REMERON) 15 MG tablet Take 15 mg by mouth at bedtime.    [provider]  naproxen (NAPROSYN) 500 MG tablet Take 1 tablet (500 mg total) by mouth 2 (two) times daily. 05/26/16   Kirichenko, Lemont Fillers, PA-C  ondansetron (ZOFRAN ODT) 4 MG disintegrating tablet Take 1 tablet (4 mg total) by mouth every 8 (eight) hours as needed for nausea or vomiting. 01/06/19   Little, Ambrose Finland, MD  promethazine (PHENERGAN) 25 MG suppository Place 1 suppository (25 mg total) rectally every 6 (six) hours as needed for nausea or vomiting. 01/06/19    Little, Ambrose Finland, MD    Family History Family History  Problem Relation Age of Onset  . Diabetes Maternal Grandmother   . Hypertension Paternal Grandmother   . Diabetes Paternal Grandmother     Social History Social History   Tobacco Use  . Smoking status: Former Smoker    Packs/day: 0.25    Types: Cigarettes    Quit date: 02/11/2015    Years since quitting: 4.3  . Smokeless tobacco: Never Used  Substance Use Topics  . Alcohol use: Yes    Comment: occasional wine cooler  . Drug use: No     Allergies   Patient has no known allergies.   Review of Systems Review of Systems  Constitutional: Positive for activity change, chills and fever.  HENT: Positive for congestion, rhinorrhea and sore throat.   Respiratory: Positive for cough, chest tightness and wheezing.   Cardiovascular: Negative for chest pain and palpitations.  Gastrointestinal: Positive for diarrhea, nausea and vomiting. Negative for abdominal pain.  Genitourinary: Negative.   Musculoskeletal: Positive for arthralgias and myalgias.  Neurological: Positive for headaches. Negative for dizziness and light-headedness.  Psychiatric/Behavioral: Negative for decreased concentration.     Physical Exam Triage Vital Signs ED Triage Vitals [06/27/19 1352]  Enc Vitals Group     BP 132/77     Pulse Rate 68     Resp 16     Temp 98.5 F (36.9 C)     Temp src      SpO2 100 %     Weight 168 lb (76.2 kg)     Height      Head Circumference      Peak Flow      Pain Score      Pain Loc      Pain Edu?      Excl. in GC?    No data found.  Updated Vital Signs BP 132/77 (BP Location: Right Arm)   Pulse 68   Temp 98.5 F (36.9 C)   Resp 16   Wt 76.2 kg   LMP 06/20/2019   SpO2 100%   BMI 25.54 kg/m   Visual Acuity Right Eye Distance:   Left Eye Distance:   Bilateral Distance:    Right Eye Near:   Left Eye Near:    Bilateral Near:     Physical Exam Vitals and nursing note reviewed.   Constitutional:      General: She is not in acute distress.    Appearance: She is not ill-appearing.  HENT:     Right Ear: Tympanic membrane  normal.     Left Ear: Tympanic membrane normal.  Cardiovascular:     Rate and Rhythm: Normal rate and regular rhythm.     Pulses: Normal pulses.     Heart sounds: Normal heart sounds.  Pulmonary:     Effort: Pulmonary effort is normal. No respiratory distress.     Breath sounds: Normal breath sounds. No stridor. No rhonchi.  Abdominal:     General: Bowel sounds are normal.     Palpations: Abdomen is soft.  Musculoskeletal:        General: No swelling or signs of injury. Normal range of motion.     Right lower leg: No edema.  Skin:    General: Skin is warm.     Capillary Refill: Capillary refill takes less than 2 seconds.     Findings: No bruising or erythema.  Neurological:     General: No focal deficit present.     Mental Status: She is alert and oriented to person, place, and time.      UC Treatments / Results  Labs (all labs ordered are listed, but only abnormal results are displayed) Labs Reviewed  NOVEL CORONAVIRUS, NAA (HOSP ORDER, SEND-OUT TO REF LAB; TAT 18-24 HRS)    EKG   Radiology No results found.  Procedures Procedures (including critical care time)  Medications Ordered in UC Medications - No data to display  Initial Impression / Assessment and Plan / UC Course  I have reviewed the triage vital signs and the nursing notes.  Pertinent labs & imaging results that were available during my care of the patient were reviewed by me and considered in my medical decision making (see chart for details).     1.  Suspected COVID-19 infection: COVID-19 PCR test done Tessalon Perles as needed for cough Albuterol inhaler as needed for shortness of breath/wheezing Patient is advised to self isolate until COVID-19 test results are available If patient's condition worsens she can return to urgent care to be  reevaluated. Final Clinical Impressions(s) / UC Diagnoses   Final diagnoses:  Loss of taste   Discharge Instructions   None    ED Prescriptions    Medication Sig Dispense Auth. Provider   albuterol (VENTOLIN HFA) 108 (90 Base) MCG/ACT inhaler Inhale 1-2 puffs into the lungs every 6 (six) hours as needed for wheezing or shortness of breath. 18 g Bain Whichard, Myrene Galas, MD   benzonatate (TESSALON) 100 MG capsule Take 1 capsule (100 mg total) by mouth every 8 (eight) hours. 21 capsule Bernard Donahoo, Myrene Galas, MD     PDMP not reviewed this encounter.   Chase Picket, MD 06/27/19 1435

## 2019-06-29 LAB — NOVEL CORONAVIRUS, NAA (HOSP ORDER, SEND-OUT TO REF LAB; TAT 18-24 HRS): SARS-CoV-2, NAA: NOT DETECTED

## 2020-08-12 ENCOUNTER — Emergency Department (HOSPITAL_COMMUNITY)
Admission: EM | Admit: 2020-08-12 | Discharge: 2020-08-12 | Disposition: A | Discharge status: Home / Self Care | Payer: Medicaid Other | Attending: Emergency Medicine | Admitting: Emergency Medicine

## 2020-08-12 ENCOUNTER — Emergency Department (HOSPITAL_COMMUNITY): Payer: Medicaid Other

## 2020-08-12 ENCOUNTER — Other Ambulatory Visit: Payer: Self-pay

## 2020-08-12 DIAGNOSIS — Z87891 Personal history of nicotine dependence: Secondary | ICD-10-CM | POA: Insufficient documentation

## 2020-08-12 DIAGNOSIS — J4541 Moderate persistent asthma with (acute) exacerbation: Secondary | ICD-10-CM | POA: Insufficient documentation

## 2020-08-12 DIAGNOSIS — J45901 Unspecified asthma with (acute) exacerbation: Secondary | ICD-10-CM

## 2020-08-12 DIAGNOSIS — Z20822 Contact with and (suspected) exposure to covid-19: Secondary | ICD-10-CM | POA: Insufficient documentation

## 2020-08-12 LAB — TROPONIN I (HIGH SENSITIVITY): Troponin I (High Sensitivity): 4 ng/L (ref <18)

## 2020-08-12 LAB — CBC WITH DIFFERENTIAL/PLATELET
Abs Immature Granulocytes: 0.04 10*3/uL (ref 0.00–0.07)
Basophils Absolute: 0 10*3/uL (ref 0.0–0.1)
Basophils Relative: 0 %
Eosinophils Absolute: 0 10*3/uL (ref 0.0–0.5)
Eosinophils Relative: 0 %
HCT: 39.9 % (ref 36.0–46.0)
Hemoglobin: 13.7 g/dL (ref 12.0–15.0)
Immature Granulocytes: 0 %
Lymphocytes Relative: 4 %
Lymphs Abs: 0.5 10*3/uL — ABNORMAL LOW (ref 0.7–4.0)
MCH: 32.5 pg (ref 26.0–34.0)
MCHC: 34.3 g/dL (ref 30.0–36.0)
MCV: 94.5 fL (ref 80.0–100.0)
Monocytes Absolute: 0.1 10*3/uL (ref 0.1–1.0)
Monocytes Relative: 1 %
Neutro Abs: 11.4 10*3/uL — ABNORMAL HIGH (ref 1.7–7.7)
Neutrophils Relative %: 95 %
Platelets: 165 10*3/uL (ref 150–400)
RBC: 4.22 MIL/uL (ref 3.87–5.11)
RDW: 12.9 % (ref 11.5–15.5)
WBC: 12.1 10*3/uL — ABNORMAL HIGH (ref 4.0–10.5)
nRBC: 0 % (ref 0.0–0.2)

## 2020-08-12 LAB — COMPREHENSIVE METABOLIC PANEL
ALT: 17 U/L (ref 0–44)
AST: 25 U/L (ref 15–41)
Albumin: 4.2 g/dL (ref 3.5–5.0)
Alkaline Phosphatase: 54 U/L (ref 38–126)
Anion gap: 15 (ref 5–15)
BUN: 5 mg/dL — ABNORMAL LOW (ref 6–20)
CO2: 18 mmol/L — ABNORMAL LOW (ref 22–32)
Calcium: 9.6 mg/dL (ref 8.9–10.3)
Chloride: 104 mmol/L (ref 98–111)
Creatinine, Ser: 0.99 mg/dL (ref 0.44–1.00)
GFR, Estimated: >60 mL/min (ref >60)
Glucose, Bld: 162 mg/dL — ABNORMAL HIGH (ref 70–99)
Potassium: 3.9 mmol/L (ref 3.5–5.1)
Sodium: 137 mmol/L (ref 135–145)
Total Bilirubin: 0.4 mg/dL (ref 0.3–1.2)
Total Protein: 7.3 g/dL (ref 6.5–8.1)

## 2020-08-12 LAB — RESP PANEL BY RT-PCR (FLU A&B, COVID) ARPGX2
Influenza A by PCR: NEGATIVE
Influenza B by PCR: NEGATIVE
SARS Coronavirus 2 by RT PCR: NEGATIVE

## 2020-08-12 MED ORDER — MAGNESIUM SULFATE 2 GM/50ML IV SOLN
2.0000 g | Freq: Once | INTRAVENOUS | Status: AC
  Administered 2020-08-12: 2 g via INTRAVENOUS
  Filled 2020-08-12: qty 50

## 2020-08-12 MED ORDER — METHYLPREDNISOLONE SODIUM SUCC 125 MG IJ SOLR
125.0000 mg | Freq: Once | INTRAMUSCULAR | Status: AC
  Administered 2020-08-12: 125 mg via INTRAVENOUS
  Filled 2020-08-12: qty 2

## 2020-08-12 MED ORDER — IPRATROPIUM-ALBUTEROL 0.5-2.5 (3) MG/3ML IN SOLN
3.0000 mL | Freq: Once | RESPIRATORY_TRACT | Status: AC
  Administered 2020-08-12: 3 mL via RESPIRATORY_TRACT
  Filled 2020-08-12: qty 3

## 2020-08-12 MED ORDER — ALBUTEROL SULFATE HFA 108 (90 BASE) MCG/ACT IN AERS: 1.0000 | INHALATION_SPRAY | Freq: Four times a day (QID) | RESPIRATORY_TRACT | 0 refills | Status: DC | PRN

## 2020-08-12 MED ORDER — BENZONATATE 100 MG PO CAPS: 100.0000 mg | ORAL_CAPSULE | Freq: Three times a day (TID) | ORAL | 0 refills | Status: DC | PRN

## 2020-08-12 MED ORDER — PREDNISONE 20 MG PO TABS: 40.0000 mg | ORAL_TABLET | Freq: Every day | ORAL | 0 refills | Status: AC

## 2020-08-12 NOTE — ED Triage Notes (Signed)
Pt with hx of asthma, from home for eval of shob onset last night. Pt called EMS last night and they gave her magnesium and albuterol with improvement in symptoms but woke up this morning with increased work of breathing. Arrives with labored breathing. Has been coughing up mucous, kids have had a cold, and they cut the grass yesterday.

## 2020-08-12 NOTE — ED Provider Notes (Signed)
Emergency Department Provider Note   I have reviewed the triage vital signs and the nursing notes.   HISTORY  Chief Complaint Shortness of Breath   HPI Meghan Salazar is a 35 y.o. female with remote history of asthma presents to the emergency department with wheezing and shortness of breath starting yesterday.  Patient states she has not had an asthma attack since she was in high school.  She does not have an up-to-date inhaler for this reason.  She notes to possible triggers including having her grass cut yesterday and her kids with mild URI symptoms.  She called EMS yesterday and was given albuterol along with magnesium.  She states she started to feel somewhat better and was not transported to the emergency department.  She continues to have shortness of breath and chest tightness this morning which prompted her ED visit.  She was not given steroid yesterday.  She denies any fevers or chills but has had cough and some runny nose.  She feels some chest tightness through her chest with no radiation of symptoms.   Past Medical History:  Diagnosis Date  . Abnormal Pap smear    biopsy  . Anxiety   . Asthma   . Bipolar 1 disorder (HCC)   . Infection    uti  . Other and unspecified ovarian cysts   . PTSD (post-traumatic stress disorder)     Patient Active Problem List   Diagnosis Date Noted  . Consultation for female sterilization 05/28/2013  . Bacterial vaginosis 03/03/2013  . Short cervix affecting pregnancy 03/03/2013  . Insufficient prenatal care in second trimester 03/03/2013  . Pyelonephritis complicating pregnancy in second trimester, antepartum 12/13/2012    Past Surgical History:  Procedure Laterality Date  . LAPAROSCOPIC BILATERAL SALPINGECTOMY Bilateral 05/28/2013   Procedure: LAPAROSCOPIC BILATERAL SALPINGECTOMY;  Surgeon: Allie Bossier, MD;  Location: [facility];  Service: Gynecology;  Laterality: Bilateral;  . NO PAST SURGERIES      Allergies Patient has no known  allergies.  Family History  Problem Relation Age of Onset  . Diabetes Maternal Grandmother   . Hypertension Paternal Grandmother   . Diabetes Paternal Grandmother     Social History Social History   Tobacco Use  . Smoking status: Former Smoker    Packs/day: 0.25    Types: Cigarettes    Quit date: 02/11/2015    Years since quitting: 5.5  . Smokeless tobacco: Never Used  Substance Use Topics  . Alcohol use: Yes    Comment: occasional wine cooler  . Drug use: No    Review of Systems  Constitutional: No fever/chills Eyes: No visual changes. ENT: No sore throat. Cardiovascular: Positive chest tightness.  Respiratory: Positive shortness of breath and wheezing.  Gastrointestinal: No abdominal pain.  No nausea, no vomiting.  No diarrhea.  No constipation. Genitourinary: Negative for dysuria. Musculoskeletal: Negative for back pain. Skin: Negative for rash. Neurological: Negative for headaches, focal weakness or numbness.  10-point ROS otherwise negative.  ____________________________________________   PHYSICAL EXAM:  VITAL SIGNS: ED Triage Vitals  Enc Vitals Group     BP 08/12/20 0810 132/85     Pulse Rate 08/12/20 0810 97     Resp 08/12/20 0810 (!) 25     Temp 08/12/20 0810 (!) 97.5 F (36.4 C)     Temp Source 08/12/20 0810 Oral     SpO2 08/12/20 0810 100 %     Weight 08/12/20 0810 176 lb (79.8 kg)     Height 08/12/20 0810  5\' 8"  (1.727 m)   Constitutional: Alert and oriented. Well appearing and in no acute distress. Eyes: Conjunctivae are normal.  Head: Atraumatic. Nose: No congestion/rhinnorhea. Mouth/Throat: Mucous membranes are moist. Neck: No stridor.  Cardiovascular: Normal rate, regular rhythm. Good peripheral circulation. Grossly normal heart sounds.   Respiratory: Increased respiratory effort.  Speaking in 3-4 word sentences then pausing to take a deep breath. No retractions. Lungs with end expiratory wheezing bilaterally. Gastrointestinal: No  distention.  Musculoskeletal: No lower extremity tenderness nor edema. No gross deformities of extremities. Neurologic:  Normal speech and language.  Skin:  Skin is warm, dry and intact. No rash noted.  ____________________________________________   LABS (all labs ordered are listed, but only abnormal results are displayed)  Labs Reviewed  COMPREHENSIVE METABOLIC PANEL - Abnormal; Notable for the following components:      Result Value   CO2 18 (*)    Glucose, Bld 162 (*)    BUN 5 (*)    All other components within normal limits  CBC WITH DIFFERENTIAL/PLATELET - Abnormal; Notable for the following components:   WBC 12.1 (*)    Neutro Abs 11.4 (*)    Lymphs Abs 0.5 (*)    All other components within normal limits  RESP PANEL BY RT-PCR (FLU A&B, COVID) ARPGX2  TROPONIN I (HIGH SENSITIVITY)   ____________________________________________  EKG   EKG Interpretation  Date/Time:  Wednesday August 12 2020 08:09:56 EDT Ventricular Rate:  85 PR Interval:  142 QRS Duration: 72 QT Interval:  398 QTC Calculation: 474 R Axis:   78 Text Interpretation: Sinus rhythm Confirmed by [birth year] [id number]) on 08/12/2020 8:12:55 AM       ____________________________________________  RADIOLOGY  DG Chest Portable 1 View  Result Date: 08/12/2020 CLINICAL DATA:  Shortness of breath EXAM: PORTABLE CHEST 1 VIEW COMPARISON:  January 06, 2019 FINDINGS: Lungs are clear. Heart size and pulmonary vascularity are normal. No adenopathy. No bone lesions. IMPRESSION: Lungs clear.  Cardiac silhouette normal. Electronically Signed   By: January 08, 2019 III M.D.   On: 08/12/2020 08:40    ____________________________________________   PROCEDURES  Procedure(s) performed:   Procedures  None  ____________________________________________   INITIAL IMPRESSION / ASSESSMENT AND PLAN / ED COURSE  Pertinent labs & imaging results that were available during my care of the patient were reviewed by me and  considered in my medical decision making (see chart for details).   Patient presents emergency department with likely asthma exacerbation.  She has several possible triggers including URI symptoms in her household as well as seasonal triggers.  Patient has wheezing on exam along with increased work of breathing.  She appears to be in some mild discomfort with deep, regular breathing which is limiting her conversation.  She is not hypoxemic.  Very low suspicion for ACS or PE.  Patient has a symmetrical exam. Plan for steroid, Mg, and albuterol. Patient may require continuous but will reassess after initial meds.   09:00 AM  Chest x-ray reviewed with no acute findings.  Reassessed patient and she is feeling improved after DuoNeb.  Continues to have some trace wheezing but notes that her chest tightness has resolved.  She is speaking more clearly.  Will hold on additional treatments for now after discussion with the patient and follow lab work and allow her magnesium to run.   10:47 AM  Patient reevaluated.  Lab work resulted showing normal troponin, kidney function, CBC.  She is feeling much better on reassessment.  Her lungs are  clear.  She is not hypoxemic.  Plan to the patient take a steroid burst over the next several days and have refilled her albuterol inhaler.  We will send prescription for Tessalon Perles with cough although asthma may be contributing to this.  She reports feeling well and is ready for discharge.  We discussed strict ED return precautions.  Provided contact information for primary care doctor.  ____________________________________________  FINAL CLINICAL IMPRESSION(S) / ED DIAGNOSES  Final diagnoses:  Moderate asthma with exacerbation, unspecified whether persistent     MEDICATIONS GIVEN DURING THIS VISIT:  Medications  ipratropium-albuterol (DUONEB) 0.5-2.5 (3) MG/3ML nebulizer solution 3 mL (3 mLs Nebulization Given 08/12/20 0836)  methylPREDNISolone sodium succinate  (SOLU-MEDROL) 125 mg/2 mL injection 125 mg (125 mg Intravenous Given 08/12/20 0835)  magnesium sulfate IVPB 2 g 50 mL (0 g Intravenous Stopped 08/12/20 0851)     NEW OUTPATIENT MEDICATIONS STARTED DURING THIS VISIT:  New Prescriptions   ALBUTEROL (VENTOLIN HFA) 108 (90 BASE) MCG/ACT INHALER    Inhale 1-2 puffs into the lungs every 6 (six) hours as needed for wheezing or shortness of breath.   BENZONATATE (TESSALON) 100 MG CAPSULE    Take 1 capsule (100 mg total) by mouth 3 (three) times daily as needed for cough.   PREDNISONE (DELTASONE) 20 MG TABLET    Take 2 tablets (40 mg total) by mouth daily for 4 days.    Note:  This document was prepared using Dragon voice recognition software and may include unintentional dictation errors.  Alona Bene, MD, [facility]    Phoebie Shad, Arlyss Repress, MD 08/12/20 1049

## 2020-08-12 NOTE — Discharge Instructions (Signed)
We believe that your symptoms are caused today by an exacerbation of your asthma.  Please take the prescribed medications and any medications that you have at home.  Follow up with your doctor as recommended.  If you develop any new or worsening symptoms, including but not limited to fever, persistent vomiting, worsening shortness of breath, or other symptoms that concern you, please return to the Emergency Department immediately. ° ° °Asthma °Asthma is a recurring condition in which the airways tighten and narrow. Asthma can make it difficult to breathe. It can cause coughing, wheezing, and shortness of breath. Asthma episodes, also called asthma attacks, range from minor to life-threatening. Asthma cannot be cured, but medicines and lifestyle changes can help control it. °CAUSES °Asthma is believed to be caused by inherited (genetic) and environmental factors, but its exact cause is unknown. Asthma may be triggered by allergens, lung infections, or irritants in the air. Asthma triggers are different for each person. Common triggers include:  °Animal dander. °Dust mites. °Cockroaches. °Pollen from trees or grass. °Mold. °Smoke. °Air pollutants such as dust, household cleaners, hair sprays, aerosol sprays, paint fumes, strong chemicals, or strong odors. °Cold air, weather changes, and winds (which increase molds and pollens in the air). °Strong emotional expressions such as crying or laughing hard. °Stress. °Certain medicines (such as aspirin) or types of drugs (such as beta-blockers). °Sulfites in foods and drinks. Foods and drinks that may contain sulfites include dried fruit, potato chips, and sparkling grape juice. °Infections or inflammatory conditions such as the flu, a cold, or an inflammation of the nasal membranes (rhinitis). °Gastroesophageal reflux disease (GERD). °Exercise or strenuous activity. °SYMPTOMS °Symptoms may occur immediately after asthma is triggered or many hours later. Symptoms  include: °Wheezing. °Excessive nighttime or early morning coughing. °Frequent or severe coughing with a common cold. °Chest tightness. °Shortness of breath. °DIAGNOSIS  °The diagnosis of asthma is made by a review of your medical history and a physical exam. Tests may also be performed. These may include: °Lung function studies. These tests show how much air you breathe in and out. °Allergy tests. °Imaging tests such as X-rays. °TREATMENT  °Asthma cannot be cured, but it can usually be controlled. Treatment involves identifying and avoiding your asthma triggers. It also involves medicines. There are 2 classes of medicine used for asthma treatment:  °Controller medicines. These prevent asthma symptoms from occurring. They are usually taken every day. °Reliever or rescue medicines. These quickly relieve asthma symptoms. They are used as needed and provide short-term relief. °Your health care provider will help you create an asthma action plan. An asthma action plan is a written plan for managing and treating your asthma attacks. It includes a list of your asthma triggers and how they may be avoided. It also includes information on when medicines should be taken and when their dosage should be changed. An action plan may also involve the use of a device called a peak flow meter. A peak flow meter measures how well the lungs are working. It helps you monitor your condition. °HOME CARE INSTRUCTIONS  °Take medicines only as directed by your health care provider. Speak with your health care provider if you have questions about how or when to take the medicines. °Use a peak flow meter as directed by your health care provider. Record and keep track of readings. °Understand and use the action plan to help minimize or stop an asthma attack without needing to seek medical care. °Control your home environment in the following   ways to help prevent asthma attacks: °Do not smoke. Avoid being exposed to secondhand smoke. °Change  your heating and air conditioning filter regularly. °Limit your use of fireplaces and wood stoves. °Get rid of pests (such as roaches and mice) and their droppings. °Throw away plants if you see mold on them. °Clean your floors and dust regularly. Use unscented cleaning products. °Try to have someone else vacuum for you regularly. Stay out of rooms while they are being vacuumed and for a short while afterward. If you vacuum, use a dust mask from a hardware store, a double-layered or microfilter vacuum cleaner bag, or a vacuum cleaner with a HEPA filter. °Replace carpet with wood, tile, or vinyl flooring. Carpet can trap dander and dust. °Use allergy-proof pillows, mattress covers, and box spring covers. °Wash bed sheets and blankets every week in hot water and dry them in a dryer. °Use blankets that are made of polyester or cotton. °Clean bathrooms and kitchens with bleach. If possible, have someone repaint the walls in these rooms with mold-resistant paint. Keep out of the rooms that are being cleaned and painted. °Wash hands frequently. °SEEK MEDICAL CARE IF:  °You have wheezing, shortness of breath, or a cough even if taking medicine to prevent attacks. °The colored mucus you cough up (sputum) is thicker than usual. °Your sputum changes from clear or white to yellow, green, gray, or bloody. °You have any problems that may be related to the medicines you are taking (such as a rash, itching, swelling, or trouble breathing). °You are using a reliever medicine more than 2-3 times per week. °Your peak flow is still at 50-79% of your personal best after following your action plan for 1 hour. °You have a fever. °SEEK IMMEDIATE MEDICAL CARE IF:  °You seem to be getting worse and are unresponsive to treatment during an asthma attack. °You are short of breath even at rest. °You get short of breath when doing very little physical activity. °You have difficulty eating, drinking, or talking due to asthma symptoms. °You  develop chest pain. °You develop a fast heartbeat. °You have a bluish color to your lips or fingernails. °You are light-headed, dizzy, or faint. °Your peak flow is less than 50% of your personal best. °MAKE SURE YOU:  °Understand these instructions. °Will watch your condition. °Will get help right away if you are not doing well or get worse. °Document Released: 05/02/2005 Document Revised: 09/16/2013 Document Reviewed: 11/29/2012 °[employer]® Patient Information ©2015 [employer]. This information is not intended to replace advice given to you by your health care provider. Make sure you discuss any questions you have with your health care provider. ° °How to Use a Nebulizer °If you have asthma or other breathing problems, you might need to breathe in (inhale) medicine. This can be done with a nebulizer. A nebulizer is a device that turns liquid medicine into a mist that you can inhale.  °There are different kinds of nebulizers. Most are small. With some, you breathe in through a mouthpiece. With others, a mask fits over your nose and mouth. Most nebulizers must be connected to a small air compressor. Air is forced through tubing from the compressor to the nebulizer. The forced air changes the liquid into a fine spray. °RISKS AND COMPLICATIONS °The nebulizer must work properly for it to help your breathing. If the nebulizer does not produce mist, or if foam comes out, this indicates that the nebulizer is not working properly. Sometimes a filter can get clogged, or   there might be a problem with the air compressor. Check the instruction booklet that came with your nebulizer. It should tell you how to fix problems or where to call for help. You should have at least one extra nebulizer at home. That way, you will always have one when you need it.  °HOW TO PREPARE BEFORE USING THE NEBULIZER °Take these steps before using the nebulizer: °Check your medicine. Make sure it has not expired and is not damaged in any way.    °Wash your hands with soap and water.   °Put all the parts of your nebulizer on a sturdy, flat surface. Make sure the tubing connects the compressor and the nebulizer. °Measure the liquid medicine according to your health care provider's instructions. Pour it into the nebulizer. °Attach the mouthpiece or mask.   °Test the nebulizer by turning it on to make sure a spray is coming out. Then, turn it off.   °HOW TO USE THE NEBULIZER °Sit down and focus on staying relaxed.   °If your nebulizer has a mask, put it over your nose and mouth. If you use a mouthpiece, put it in your mouth. Press your lips firmly around the mouthpiece. °Turn on the nebulizer.   °Breathe out.   °Some nebulizers have a finger valve. If yours does, cover up the air hole so the air gets to the nebulizer. °Once the medicine begins to mist out, take slow, deep breaths. If there is a finger valve, release it at the end of your breath. °Continue taking slow, deep breaths until the nebulizer is empty.   °Be sure to stop the machine at any point if you start coughing or if the medicine foams or bubbles. °HOW TO CLEAN THE NEBULIZER  °The nebulizer and all its parts must be kept very clean. Follow the manufacturer's instructions for cleaning. For most nebulizers, you should follow these guidelines: °Wash the nebulizer after each use. Use warm water and soap. Rinse it well. Shake the nebulizer to remove extra water. Put it on a clean towel until it is completely dry. To make sure it is dry, put the nebulizer back together. Turn on the compressor for a few minutes. This will blow air through the nebulizer.   °Do not wash the tubing or the finger valve.   °Store the nebulizer in a dust-free place.   °Inspect the filter every week. Replace it any time it looks dirty.   °Sometimes the nebulizer will need a more complete cleaning. The instruction booklet should say how often you need to do this. °SEEK MEDICAL CARE IF:  °You continue to have difficulty  breathing.   °You have trouble using the nebulizer.   °Document Released: 04/20/2009 Document Revised: 09/16/2013 Document Reviewed: 10/22/2012 °[employer]® Patient Information ©2015 [employer]. This information is not intended to replace advice given to you by your health care provider. Make sure you discuss any questions you have with your health care provider. ° °How to Use an Inhaler °Proper inhaler technique is very important. Good technique ensures that the medicine reaches the lungs. Poor technique results in depositing the medicine on the tongue and back of the throat rather than in the airways. If you do not use the inhaler with good technique, the medicine will not help you. °STEPS TO FOLLOW IF USING AN INHALER WITHOUT AN EXTENSION TUBE °Remove the cap from the inhaler. °If you are using the inhaler for the first time, you will need to prime it. Shake the inhaler for   5 seconds and release four puffs into the air, away from your face. Ask your health care provider or pharmacist if you have questions about priming your inhaler. °Shake the inhaler for 5 seconds before each breath in (inhalation). °Position the inhaler so that the top of the canister faces up. °Put your index finger on the top of the medicine canister. Your thumb supports the bottom of the inhaler. °Open your mouth. °Either place the inhaler between your teeth and place your lips tightly around the mouthpiece, or hold the inhaler 1-2 inches away from your open mouth. If you are unsure of which technique to use, ask your health care provider. °Breathe out (exhale) normally and as completely as possible. °Press the canister down with your index finger to release the medicine. °At the same time as the canister is pressed, inhale deeply and slowly until your lungs are completely filled. This should take 4-6 seconds. Keep your tongue down. °Hold the medicine in your lungs for 5-10 seconds (10 seconds is best). This helps the medicine get into the  small airways of your lungs. °Breathe out slowly, through pursed lips. Whistling is an example of pursed lips. °Wait at least 15-30 seconds between puffs. Continue with the above steps until you have taken the number of puffs your health care provider has ordered. Do not use the inhaler more than your health care provider tells you. °Replace the cap on the inhaler. °Follow the directions from your health care provider or the inhaler insert for cleaning the inhaler. °STEPS TO FOLLOW IF USING AN INHALER WITH AN EXTENSION (SPACER) °Remove the cap from the inhaler. °If you are using the inhaler for the first time, you will need to prime it. Shake the inhaler for 5 seconds and release four puffs into the air, away from your face. Ask your health care provider or pharmacist if you have questions about priming your inhaler. °Shake the inhaler for 5 seconds before each breath in (inhalation). °Place the open end of the spacer onto the mouthpiece of the inhaler. °Position the inhaler so that the top of the canister faces up and the spacer mouthpiece faces you. °Put your index finger on the top of the medicine canister. Your thumb supports the bottom of the inhaler and the spacer. °Breathe out (exhale) normally and as completely as possible. °Immediately after exhaling, place the spacer between your teeth and into your mouth. Close your lips tightly around the spacer. °Press the canister down with your index finger to release the medicine. °At the same time as the canister is pressed, inhale deeply and slowly until your lungs are completely filled. This should take 4-6 seconds. Keep your tongue down and out of the way. °Hold the medicine in your lungs for 5-10 seconds (10 seconds is best). This helps the medicine get into the small airways of your lungs. Exhale. °Repeat inhaling deeply through the spacer mouthpiece. Again hold that breath for up to 10 seconds (10 seconds is best). Exhale slowly. If it is difficult to take  this second deep breath through the spacer, breathe normally several times through the spacer. Remove the spacer from your mouth. °Wait at least 15-30 seconds between puffs. Continue with the above steps until you have taken the number of puffs your health care provider has ordered. Do not use the inhaler more than your health care provider tells you. °Remove the spacer from the inhaler, and place the cap on the inhaler. °Follow the directions from your health care provider   or the inhaler insert for cleaning the inhaler and spacer. °If you are using different kinds of inhalers, use your quick relief medicine to open the airways 10-15 minutes before using a steroid if instructed to do so by your health care provider. If you are unsure which inhalers to use and the order of using them, ask your health care provider, nurse, or respiratory therapist. °If you are using a steroid inhaler, always rinse your mouth with water after your last puff, then gargle and spit out the water. Do not swallow the water. °AVOID: °Inhaling before or after starting the spray of medicine. It takes practice to coordinate your breathing with triggering the spray. °Inhaling through the nose (rather than the mouth) when triggering the spray. °HOW TO DETERMINE IF YOUR INHALER IS FULL OR NEARLY EMPTY °You cannot know when an inhaler is empty by shaking it. A few inhalers are now being made with dose counters. Ask your health care provider for a prescription that has a dose counter if you feel you need that extra help. If your inhaler does not have a counter, ask your health care provider to help you determine the date you need to refill your inhaler. Write the refill date on a calendar or your inhaler canister. Refill your inhaler 7-10 days before it runs out. Be sure to keep an adequate supply of medicine. This includes making sure it is not expired, and that you have a spare inhaler.  °SEEK MEDICAL CARE IF:  °Your symptoms are only partially  relieved with your inhaler. °You are having trouble using your inhaler. °You have some increase in phlegm. °SEEK IMMEDIATE MEDICAL CARE IF:  °You feel little or no relief with your inhalers. You are still wheezing and are feeling shortness of breath or tightness in your chest or both. °You have dizziness, headaches, or a fast heart rate. °You have chills, fever, or night sweats. °You have a noticeable increase in phlegm production, or there is blood in the phlegm. °MAKE SURE YOU:  °Understand these instructions. °Will watch your condition. °Will get help right away if you are not doing well or get worse. °Document Released: 04/29/2000 Document Revised: 02/20/2013 Document Reviewed: 11/29/2012 °[employer]® Patient Information ©2015 [employer]. This information is not intended to replace advice given to you by your health care provider. Make sure you discuss any questions you have with your health care provider. ° ° ° °

## 2021-08-08 ENCOUNTER — Emergency Department: Payer: Self-pay

## 2021-08-08 ENCOUNTER — Other Ambulatory Visit: Payer: Self-pay

## 2021-08-08 ENCOUNTER — Encounter: Payer: Self-pay | Admitting: Emergency Medicine

## 2021-08-08 ENCOUNTER — Emergency Department
Admission: EM | Admit: 2021-08-08 | Discharge: 2021-08-08 | Disposition: A | Discharge status: Home / Self Care | Payer: Self-pay | Attending: Emergency Medicine | Admitting: Emergency Medicine

## 2021-08-08 DIAGNOSIS — E860 Dehydration: Secondary | ICD-10-CM | POA: Insufficient documentation

## 2021-08-08 DIAGNOSIS — J45909 Unspecified asthma, uncomplicated: Secondary | ICD-10-CM | POA: Insufficient documentation

## 2021-08-08 DIAGNOSIS — R1084 Generalized abdominal pain: Secondary | ICD-10-CM | POA: Insufficient documentation

## 2021-08-08 DIAGNOSIS — R112 Nausea with vomiting, unspecified: Secondary | ICD-10-CM | POA: Insufficient documentation

## 2021-08-08 DIAGNOSIS — G4762 Sleep related leg cramps: Secondary | ICD-10-CM | POA: Insufficient documentation

## 2021-08-08 LAB — CBC WITH DIFFERENTIAL/PLATELET
Abs Immature Granulocytes: 0.04 10*3/uL (ref 0.00–0.07)
Basophils Absolute: 0.1 10*3/uL (ref 0.0–0.1)
Basophils Relative: 1 %
Eosinophils Absolute: 0 10*3/uL (ref 0.0–0.5)
Eosinophils Relative: 0 %
HCT: 42.2 % (ref 36.0–46.0)
Hemoglobin: 14.6 g/dL (ref 12.0–15.0)
Immature Granulocytes: 0 %
Lymphocytes Relative: 10 %
Lymphs Abs: 1.5 10*3/uL (ref 0.7–4.0)
MCH: 31.5 pg (ref 26.0–34.0)
MCHC: 34.6 g/dL (ref 30.0–36.0)
MCV: 90.9 fL (ref 80.0–100.0)
Monocytes Absolute: 0.7 10*3/uL (ref 0.1–1.0)
Monocytes Relative: 4 %
Neutro Abs: 13 10*3/uL — ABNORMAL HIGH (ref 1.7–7.7)
Neutrophils Relative %: 85 %
Platelets: 218 10*3/uL (ref 150–400)
RBC: 4.64 MIL/uL (ref 3.87–5.11)
RDW: 13 % (ref 11.5–15.5)
WBC: 15.3 10*3/uL — ABNORMAL HIGH (ref 4.0–10.5)
nRBC: 0 % (ref 0.0–0.2)

## 2021-08-08 LAB — COMPREHENSIVE METABOLIC PANEL
ALT: 19 U/L (ref 0–44)
AST: 37 U/L (ref 15–41)
Albumin: 5.1 g/dL — ABNORMAL HIGH (ref 3.5–5.0)
Alkaline Phosphatase: 61 U/L (ref 38–126)
Anion gap: 14 (ref 5–15)
BUN: 11 mg/dL (ref 6–20)
CO2: 22 mmol/L (ref 22–32)
Calcium: 9.7 mg/dL (ref 8.9–10.3)
Chloride: 103 mmol/L (ref 98–111)
Creatinine, Ser: 0.81 mg/dL (ref 0.44–1.00)
GFR, Estimated: >60 mL/min (ref >60)
Glucose, Bld: 152 mg/dL — ABNORMAL HIGH (ref 70–99)
Potassium: 3.1 mmol/L — ABNORMAL LOW (ref 3.5–5.1)
Sodium: 139 mmol/L (ref 135–145)
Total Bilirubin: 0.8 mg/dL (ref 0.3–1.2)
Total Protein: 8.4 g/dL — ABNORMAL HIGH (ref 6.5–8.1)

## 2021-08-08 LAB — ETHANOL: Alcohol, Ethyl (B): <10 mg/dL (ref <10)

## 2021-08-08 LAB — LIPASE, BLOOD: Lipase: 36 U/L (ref 11–51)

## 2021-08-08 MED ORDER — ONDANSETRON HCL 4 MG/2ML IJ SOLN
4.0000 mg | Freq: Once | INTRAMUSCULAR | Status: AC
  Administered 2021-08-08: 4 mg via INTRAVENOUS
  Filled 2021-08-08: qty 2

## 2021-08-08 MED ORDER — ONDANSETRON 4 MG PO TBDP: 8.0000 mg | ORAL_TABLET | Freq: Once | ORAL | Status: DC

## 2021-08-08 MED ORDER — POTASSIUM CHLORIDE 10 MEQ/100ML IV SOLN
10.0000 meq | INTRAVENOUS | Status: DC
  Administered 2021-08-08 (×2): 10 meq via INTRAVENOUS
  Filled 2021-08-08 (×2): qty 100

## 2021-08-08 MED ORDER — IOHEXOL 300 MG/ML  SOLN
80.0000 mL | Freq: Once | INTRAMUSCULAR | Status: AC | PRN
  Administered 2021-08-08: 80 mL via INTRAVENOUS

## 2021-08-08 MED ORDER — SODIUM CHLORIDE 0.9 % IV BOLUS
1000.0000 mL | Freq: Once | INTRAVENOUS | Status: AC
  Administered 2021-08-08: 1000 mL via INTRAVENOUS

## 2021-08-08 MED ORDER — ONDANSETRON 8 MG PO TBDP: 8.0000 mg | ORAL_TABLET | Freq: Three times a day (TID) | ORAL | 0 refills | Status: AC | PRN

## 2021-08-08 MED ORDER — DROPERIDOL 2.5 MG/ML IJ SOLN
2.5000 mg | Freq: Once | INTRAMUSCULAR | Status: AC
  Administered 2021-08-08: 2.5 mg via INTRAVENOUS
  Filled 2021-08-08: qty 2

## 2021-08-08 NOTE — ED Notes (Signed)
Pt able to tolerate saltines w/out N/V. ?

## 2021-08-08 NOTE — ED Triage Notes (Signed)
Pt via POV from home. Pt c/o NVD since 0800 this AM, states that she did go out last night and consumed ETOH and smoked some weed. Pt c/o bilateral leg pain, states they are cramping. Pt is uncooperative and wont sit still. Pt is A&Ox4 and NAD ?

## 2021-08-08 NOTE — ED Notes (Signed)
Pt able to ambulate to toilet and back w/stand by assist. Pt stated dizziness upon ambulation. ?

## 2021-08-08 NOTE — ED Provider Notes (Signed)
? ?[facility] ?Provider Note ? ? Event Date/Time  ? First MD Initiated Contact with Patient 08/08/21 1608   ?  (approximate) ?History  ?Emesis ? ?HPI ?Meghan Salazar is a 36 y.o. female with stated past medical history of asthma who presents for nausea/vomiting/abdominal pain since 8 AM this morning.  Patient states that she did go out last night and consumed heavy amount of alcohol and marijuana.  Patient also complains of bilateral leg cramping.  Patient refusing to answer any further questions that she states she "hurts too bad". ?Physical Exam  ?Triage Vital Signs: ?ED Triage Vitals  ?Enc Vitals Group  ?   BP 08/08/21 1559 (!) 159/135  ?   Pulse Rate 08/08/21 1559 (!) 128  ?   Resp 08/08/21 1559 20  ?   Temp 08/08/21 2006 98 ?F (36.7 ?C)  ?   Temp Source 08/08/21 1559 Oral  ?   SpO2 08/08/21 1559 99 %  ?   Weight 08/08/21 1557 178 lb (80.7 kg)  ?   Height 08/08/21 1557 5\' 8"  (1.727 m)  ?   Head Circumference --   ?   Peak Flow --   ?   Pain Score 08/08/21 1557 10  ?   Pain Loc --   ?   Pain Edu? --   ?   Excl. in [place]? --   ? ?Most recent vital signs: ?Vitals:  ? 08/08/21 2008 08/08/21 2140  ?BP:  (!) 145/92  ?Pulse:  65  ?Resp:  14  ?Temp:    ?SpO2: 100% 100%  ? ?General: Awake, oriented x4. ?CV:  Good peripheral perfusion.  ?Resp:  Normal effort.  ?Abd:  No distention.  Tenderness to palpation generally ?Other:  Middle-aged African-American female rolling around on the stretcher moaning ?ED Results / Procedures / Treatments  ?Labs ?(all labs ordered are listed, but only abnormal results are displayed) ?Labs Reviewed  ?COMPREHENSIVE METABOLIC PANEL - Abnormal; Notable for the following components:  ?    Result Value  ? Potassium 3.1 (*)   ? Glucose, Bld 152 (*)   ? Total Protein 8.4 (*)   ? Albumin 5.1 (*)   ? All other components within normal limits  ?CBC WITH DIFFERENTIAL/PLATELET - Abnormal; Notable for the following components:  ? WBC 15.3 (*)   ? Neutro Abs 13.0 (*)   ? All other  components within normal limits  ?LIPASE, BLOOD  ?ETHANOL  ? ?RADIOLOGY ?ED MD interpretation: CT of the abdomen pelvis with IV contrast as interpreted by me shows no acute or active process within the abdomen or pelvis other than colonic diverticulosis ?-Agree with radiology assessment ?Official radiology report(s): ?CT Abdomen Pelvis W Contrast ? ?Result Date: 08/08/2021 ?CLINICAL DATA:  Nausea, vomiting and diarrhea. EXAM: CT ABDOMEN AND PELVIS WITH CONTRAST TECHNIQUE: Multidetector CT imaging of the abdomen and pelvis was performed using the standard protocol following bolus administration of intravenous contrast. RADIATION DOSE REDUCTION: This exam was performed according to the departmental dose-optimization program which includes automated exposure control, adjustment of the mA and/or kV according to patient size and/or use of iterative reconstruction technique. CONTRAST:  71mL OMNIPAQUE IOHEXOL 300 MG/ML  SOLN COMPARISON:  January 06, 2019 FINDINGS: Lower chest: No acute abnormality. Hepatobiliary: No focal liver abnormality is seen. No gallstones, gallbladder wall thickening, or biliary dilatation. Pancreas: Unremarkable. No pancreatic ductal dilatation or surrounding inflammatory changes. Spleen: Normal in size without focal abnormality. Adrenals/Urinary Tract: Adrenal glands are unremarkable. Kidneys are normal, without renal  calculi, focal lesion, or hydronephrosis. Mild prominence of a right extrarenal pelvis is noted. Bladder is unremarkable. Stomach/Bowel: Stomach is within normal limits. Appendix appears normal. No evidence of bowel wall thickening, distention, or inflammatory changes. Noninflamed diverticula are seen along the hepatic flexure and distal transverse colon Vascular/Lymphatic: No significant vascular findings are present. No enlarged abdominal or pelvic lymph nodes. Reproductive: Uterus and bilateral adnexa are unremarkable. Other: No abdominal wall hernia or abnormality. No  abdominopelvic ascites. Musculoskeletal: No acute or significant osseous findings. IMPRESSION: 1. No acute or active process within the abdomen or pelvis. 2. Colonic diverticulosis. Electronically Signed   By: Virgina Norfolk M.D.   On: 08/08/2021 21:13   ?PROCEDURES: ?Critical Care performed: No ?.1-3 Lead EKG Interpretation ?Performed by: Naaman Plummer, MD ?Authorized by: Naaman Plummer, MD  ? ?  Interpretation: normal   ?  ECG rate:  66 ?  ECG rate assessment: normal   ?  Rhythm: sinus rhythm   ?  Ectopy: none   ?  Conduction: normal   ?MEDICATIONS ORDERED IN ED: ?Medications  ?ondansetron (ZOFRAN-ODT) disintegrating tablet 8 mg (8 mg Oral Not Given 08/08/21 1634)  ?potassium chloride 10 mEq in 100 mL IVPB (10 mEq Intravenous New Bag/Given 08/08/21 2142)  ?ondansetron [facility]) injection 4 mg (4 mg Intravenous Given 08/08/21 1634)  ?sodium chloride 0.9 % bolus 1,000 mL (0 mLs Intravenous Stopped 08/08/21 2019)  ?droperidol (INAPSINE) 2.5 MG/ML injection 2.5 mg (2.5 mg Intravenous Given 08/08/21 2011)  ?iohexol (OMNIPAQUE) 300 MG/ML solution 80 mL (80 mLs Intravenous Contrast Given 08/08/21 2045)  ? ?IMPRESSION / MDM / ASSESSMENT AND PLAN / ED COURSE  ?I reviewed the triage vital signs and the nursing notes. ?             ?               ?The patient is on the cardiac monitor to evaluate for evidence of arrhythmia and/or significant heart rate changes. ?Patient presents for acute nausea/vomiting ?The cause of the patient?s symptoms is not clear, but the patient is overall well appearing and is suspected to have a transient course of illness. ? ?Given History and Exam there does not appear to be an emergent cause of the symptoms such as small bowel obstruction, coronary syndrome, bowel ischemia, DKA, pancreatitis, appendicitis, other acute abdomen or other emergent problem. ? ?Reassessment: After treatment, the patient is feeling much better, tolerating PO fluids, and shows no signs of dehydration.  ? ?Disposition:  Discharge home with prompt primary care physician follow up in the next 48 hours. Strict return precautions discussed. ? ?  ?FINAL CLINICAL IMPRESSION(S) / ED DIAGNOSES  ? ?Final diagnoses:  ?Nausea and vomiting, unspecified vomiting type  ?Dehydration  ?Generalized abdominal pain  ? ?Rx / DC Orders  ? ?ED Discharge Orders   ? ?      Ordered  ?  ondansetron (ZOFRAN-ODT) 8 MG disintegrating tablet  Every 8 hours PRN       ? 08/08/21 2241  ? ?  ?  ? ?  ? ?Note:  This document was prepared using Dragon voice recognition software and may include unintentional dictation errors. ?  ?Naaman Plummer, MD ?08/08/21 2242 ? ?

## 2021-08-08 NOTE — ED Notes (Signed)
Pt denies nausea and taken to CT at this time.   ?

## 2021-08-08 NOTE — ED Notes (Signed)
Pt PO challenged with water.  Tolerated w/no N/V.  Pt given saltines at this time. ?

## 2022-02-19 ENCOUNTER — Encounter (HOSPITAL_COMMUNITY): Payer: Self-pay | Admitting: Emergency Medicine

## 2022-02-19 ENCOUNTER — Emergency Department (HOSPITAL_COMMUNITY)
Admission: EM | Admit: 2022-02-19 | Discharge: 2022-02-20 | Discharge status: Against Medical Advice | Payer: Medicaid Other | Attending: Emergency Medicine | Admitting: Emergency Medicine

## 2022-02-19 ENCOUNTER — Other Ambulatory Visit: Payer: Self-pay

## 2022-02-19 DIAGNOSIS — R5383 Other fatigue: Secondary | ICD-10-CM | POA: Insufficient documentation

## 2022-02-19 DIAGNOSIS — J029 Acute pharyngitis, unspecified: Secondary | ICD-10-CM | POA: Insufficient documentation

## 2022-02-19 DIAGNOSIS — Z20822 Contact with and (suspected) exposure to covid-19: Secondary | ICD-10-CM | POA: Insufficient documentation

## 2022-02-19 DIAGNOSIS — Z5321 Procedure and treatment not carried out due to patient leaving prior to being seen by health care provider: Secondary | ICD-10-CM | POA: Insufficient documentation

## 2022-02-19 LAB — GROUP A STREP BY PCR: Group A Strep by PCR: NOT DETECTED

## 2022-02-19 LAB — SARS CORONAVIRUS 2 BY RT PCR: SARS Coronavirus 2 by RT PCR: NEGATIVE

## 2022-02-19 NOTE — ED Triage Notes (Signed)
Pt reported to ED with c/o fatigue, cough, runny nose and sore throat that as been ongoing for one week.

## 2022-02-20 ENCOUNTER — Encounter: Payer: Self-pay | Admitting: Emergency Medicine

## 2022-02-20 ENCOUNTER — Ambulatory Visit
Admission: EM | Admit: 2022-02-20 | Discharge: 2022-02-20 | Disposition: A | Discharge status: Home / Self Care | Payer: Self-pay | Attending: Internal Medicine | Admitting: Internal Medicine

## 2022-02-20 DIAGNOSIS — J4521 Mild intermittent asthma with (acute) exacerbation: Secondary | ICD-10-CM

## 2022-02-20 DIAGNOSIS — B349 Viral infection, unspecified: Secondary | ICD-10-CM

## 2022-02-20 MED ORDER — PREDNISONE 20 MG PO TABS: 40.0000 mg | ORAL_TABLET | Freq: Every day | ORAL | 0 refills | Status: AC

## 2022-02-20 MED ORDER — BENZONATATE 100 MG PO CAPS: 100.0000 mg | ORAL_CAPSULE | Freq: Three times a day (TID) | ORAL | 0 refills | Status: AC | PRN

## 2022-02-20 MED ORDER — ALBUTEROL SULFATE HFA 108 (90 BASE) MCG/ACT IN AERS: 1.0000 | INHALATION_SPRAY | Freq: Four times a day (QID) | RESPIRATORY_TRACT | 0 refills | Status: AC | PRN

## 2022-02-20 NOTE — Discharge Instructions (Signed)
It appears that you have a viral illness that is causing a flareup of your asthma.  I have refilled your albuterol inhaler and prescribed prednisone to decrease inflammation and help with symptoms.  A cough medication has also been prescribed for you.  Please follow-up if symptoms persist or worsen.

## 2022-02-20 NOTE — ED Triage Notes (Signed)
Pt is present today with c/o chills, hot sweats, cough, fatigue, diarrhea, and nasal congestion., Pt sx started one week ago.

## 2022-02-20 NOTE — ED Notes (Signed)
Called pt multiple times for vitals with no answer

## 2022-02-20 NOTE — ED Provider Notes (Signed)
[name]    CSN: [id number] Arrival date & time: 02/20/22  1420      History   Chief Complaint Chief Complaint  Patient presents with   Shortness of Breath   Cough   Nasal Congestion   Chills    HPI Linetta Manzella is a 36 y.o. female.   Patient presents with chills, sweats, cough, fatigue, diarrhea, nasal congestion, sore throat that started about a week ago.  Patient went to the ED yesterday and had negative strep and COVID testing but left before being seen.  Denies chest pain but does endorse some intermittent shortness of breath given that she has a history of asthma.  She does not have albuterol inhaler so has not been able to use this to help alleviate symptoms.  Denies any known fevers or sick contacts.  Denies nausea, vomiting, abdominal pain.   Shortness of Breath Cough   Past Medical History:  Diagnosis Date   Abnormal Pap smear    biopsy   Anxiety    Asthma    Bipolar 1 disorder (HCC)    Infection    uti   Other and unspecified ovarian cysts    PTSD (post-traumatic stress disorder)     Patient Active Problem List   Diagnosis Date Noted   Consultation for female sterilization 05/28/2013   Bacterial vaginosis 03/03/2013   Short cervix affecting pregnancy 03/03/2013   Insufficient prenatal care in second trimester 03/03/2013   Pyelonephritis complicating pregnancy in second trimester, antepartum 12/13/2012    Past Surgical History:  Procedure Laterality Date   LAPAROSCOPIC BILATERAL SALPINGECTOMY Bilateral 05/28/2013   Procedure: LAPAROSCOPIC BILATERAL SALPINGECTOMY;  Surgeon: Emily Filbert, MD;  Location: [facility];  Service: Gynecology;  Laterality: Bilateral;   NO PAST SURGERIES      OB History     Gravida  3   Para  3   Term  2   Preterm  1   AB      Living  3      SAB      IAB      Ectopic      Multiple      Live Births  3            Home Medications    Prior to Admission medications   Medication Sig  Start Date End Date Taking? Authorizing Provider  albuterol (VENTOLIN HFA) 108 (90 Base) MCG/ACT inhaler Inhale 1-2 puffs into the lungs every 6 (six) hours as needed for wheezing or shortness of breath. 02/20/22  Yes Robey Massmann, Hildred Alamin E, FNP  benzonatate (TESSALON) 100 MG capsule Take 1 capsule (100 mg total) by mouth every 8 (eight) hours as needed for cough. 02/20/22  Yes Montgomery Rothlisberger, Hildred Alamin E, FNP  predniSONE (DELTASONE) 20 MG tablet Take 2 tablets (40 mg total) by mouth daily for 5 days. 02/20/22 02/25/22 Yes Wahid Holley, Michele Rockers, FNP  guaiFENesin (ROBITUSSIN) 100 MG/5ML liquid Take 400 mg by mouth every 4 (four) hours as needed for cough. Patient not taking: Reported on 08/08/2021    [provider]  lamoTRIgine (LAMICTAL) 25 MG tablet Take 25 mg by mouth 2 (two) times daily. Patient not taking: Reported on 08/08/2021    [provider]  mirtazapine (REMERON) 15 MG tablet Take 15 mg by mouth at bedtime. Patient not taking: Reported on 08/08/2021    [provider]  ondansetron (ZOFRAN-ODT) 8 MG disintegrating tablet Take 1 tablet (8 mg total) by mouth every 8 (eight) hours as needed  for nausea or vomiting. 08/08/21   Naaman Plummer, MD    Family History Family History  Problem Relation Age of Onset   Diabetes Maternal Grandmother    Hypertension Paternal Grandmother    Diabetes Paternal Grandmother     Social History Social History   Tobacco Use   Smoking status: Former    Packs/day: 0.25    Types: Cigarettes    Quit date: 02/11/2015    Years since quitting: 7.0   Smokeless tobacco: Never  Substance Use Topics   Alcohol use: Yes    Comment: occasional wine cooler   Drug use: No     Allergies   Patient has no known allergies.   Review of Systems Review of Systems Per HPI  Physical Exam Triage Vital Signs ED Triage Vitals [02/20/22 1520]  Enc Vitals Group     BP 131/83     Pulse Rate 77     Resp 18     Temp 97.9 F (36.6 C)     Temp src      SpO2 99 %      Weight      Height      Head Circumference      Peak Flow      Pain Score 0     Pain Loc      Pain Edu?      Excl. in [place]?    No data found.  Updated Vital Signs BP 131/83   Pulse 77   Temp 97.9 F (36.6 C)   Resp 18   SpO2 99%   Visual Acuity Right Eye Distance:   Left Eye Distance:   Bilateral Distance:    Right Eye Near:   Left Eye Near:    Bilateral Near:     Physical Exam Constitutional:      General: She is not in acute distress.    Appearance: Normal appearance. She is not toxic-appearing or diaphoretic.  HENT:     Head: Normocephalic and atraumatic.     Right Ear: Tympanic membrane and ear canal normal.     Left Ear: Tympanic membrane and ear canal normal.     Nose: Congestion present.     Mouth/Throat:     Mouth: Mucous membranes are moist.     Pharynx: Posterior oropharyngeal erythema present.  Eyes:     Extraocular Movements: Extraocular movements intact.     Conjunctiva/sclera: Conjunctivae normal.     Pupils: Pupils are equal, round, and reactive to light.  Cardiovascular:     Rate and Rhythm: Normal rate and regular rhythm.     Pulses: Normal pulses.     Heart sounds: Normal heart sounds.  Pulmonary:     Effort: Pulmonary effort is normal. No respiratory distress.     Breath sounds: No stridor. Wheezing present. No rhonchi or rales.     Comments: Mild wheezing to left upper lung.  Abdominal:     General: Abdomen is flat. Bowel sounds are normal.     Palpations: Abdomen is soft.  Musculoskeletal:        General: Normal range of motion.     Cervical back: Normal range of motion.  Skin:    General: Skin is warm and dry.  Neurological:     General: No focal deficit present.     Mental Status: She is alert and oriented to person, place, and time. Mental status is at baseline.  Psychiatric:        Mood and Affect:  Mood normal.        Behavior: Behavior normal.      UC Treatments / Results  Labs (all labs ordered are listed, but only  abnormal results are displayed) Labs Reviewed - No data to display  EKG   Radiology No results found.  Procedures Procedures (including critical care time)  Medications Ordered in UC Medications - No data to display  Initial Impression / Assessment and Plan / UC Course  I have reviewed the triage vital signs and the nursing notes.  Pertinent labs & imaging results that were available during my care of the patient were reviewed by me and considered in my medical decision making (see chart for details).     Patient presents with symptoms likely from a viral upper respiratory infection.  Patient is nontoxic appearing and not in need of emergent medical intervention.  Suspect mild asthma exacerbation due to acute illness given wheezing noted on exam.  Will refill albuterol inhaler for patient to take as well as prednisone to decrease inflammation.  Patient does not take any daily medications and has taken prednisone before so prednisone should be safe.  Recommended symptom control with over the counter medications as well.  Discussed supportive care and symptom management as well.  Return if symptoms fail to improve. Patient states understanding and is agreeable.  Discharged with PCP followup.  Final Clinical Impressions(s) / UC Diagnoses   Final diagnoses:  Viral illness  Mild intermittent asthma with acute exacerbation     Discharge Instructions      It appears that you have a viral illness that is causing a flareup of your asthma.  I have refilled your albuterol inhaler and prescribed prednisone to decrease inflammation and help with symptoms.  A cough medication has also been prescribed for you.  Please follow-up if symptoms persist or worsen.    ED Prescriptions     Medication Sig Dispense Auth. Provider   predniSONE (DELTASONE) 20 MG tablet Take 2 tablets (40 mg total) by mouth daily for 5 days. 10 tablet [place], Hildred Alamin E, [place]   albuterol (VENTOLIN HFA) 108 (90 Base)  MCG/ACT inhaler Inhale 1-2 puffs into the lungs every 6 (six) hours as needed for wheezing or shortness of breath. 1 each [place], [place] E, [place]   benzonatate (TESSALON) 100 MG capsule Take 1 capsule (100 mg total) by mouth every 8 (eight) hours as needed for cough. 21 capsule [place], Michele Rockers, [place]      PDMP not reviewed this encounter.   Teodora Medici, [place] 02/20/22 1601

## 2022-05-21 ENCOUNTER — Other Ambulatory Visit: Payer: Self-pay

## 2022-05-21 ENCOUNTER — Encounter: Payer: Self-pay | Admitting: Emergency Medicine

## 2022-05-21 ENCOUNTER — Ambulatory Visit
Admission: EM | Admit: 2022-05-21 | Discharge: 2022-05-21 | Disposition: A | Discharge status: Home / Self Care | Payer: Self-pay | Attending: Nurse Practitioner | Admitting: Nurse Practitioner

## 2022-05-21 DIAGNOSIS — Z1152 Encounter for screening for COVID-19: Secondary | ICD-10-CM | POA: Insufficient documentation

## 2022-05-21 DIAGNOSIS — R6889 Other general symptoms and signs: Secondary | ICD-10-CM | POA: Insufficient documentation

## 2022-05-21 DIAGNOSIS — B349 Viral infection, unspecified: Secondary | ICD-10-CM | POA: Insufficient documentation

## 2022-05-21 DIAGNOSIS — Z79899 Other long term (current) drug therapy: Secondary | ICD-10-CM | POA: Insufficient documentation

## 2022-05-21 DIAGNOSIS — R051 Acute cough: Secondary | ICD-10-CM | POA: Insufficient documentation

## 2022-05-21 LAB — SARS CORONAVIRUS 2 (TAT 6-24 HRS): SARS Coronavirus 2: NEGATIVE

## 2022-05-21 MED ORDER — OSELTAMIVIR PHOSPHATE 75 MG PO CAPS: 75.0000 mg | ORAL_CAPSULE | Freq: Two times a day (BID) | ORAL | 0 refills | Status: AC

## 2022-05-21 MED ORDER — PROMETHAZINE-DM 6.25-15 MG/5ML PO SYRP: 5.0000 mL | ORAL_SOLUTION | Freq: Four times a day (QID) | ORAL | 0 refills | Status: AC | PRN

## 2022-05-21 NOTE — Discharge Instructions (Signed)
Tamiflu twice daily for 5 days Cough syrup as needed Continue over-the-counter Tylenol or ibuprofen as needed Rest and fluids Please follow-up with your PCP 2 to 3 days for recheck Please go the emergency room if you have any worsening symptoms

## 2022-05-21 NOTE — ED Provider Notes (Signed)
[name]    CSN: [id number] Arrival date & time: 05/21/22  1302      History   Chief Complaint Chief Complaint  Patient presents with   Cough    HPI Meghan Salazar is a 37 y.o. female who presents for evaluation of URI symptoms for 2 days. Patient reports associated symptoms of fever 101 degrees, cough, congestion, sore throat, body aches. Denies N/V/D, ear pain or shortness of breath.Patient does have a hx of asthma.  She has an albuterol inhaler but has not had to use since symptom onset.  No smoking.  Patient works at a nursing home.  No recent travel. Pt is vaccinated for COVID. Pt is vaccinated for flu this season. Pt has taken Mucinex and Tylenol OTC for symptoms. Pt has no other concerns at this time.    Cough Associated symptoms: fever, myalgias and sore throat     Past Medical History:  Diagnosis Date   Abnormal Pap smear    biopsy   Anxiety    Asthma    Bipolar 1 disorder (HCC)    Infection    uti   Other and unspecified ovarian cysts    PTSD (post-traumatic stress disorder)     Patient Active Problem List   Diagnosis Date Noted   Consultation for female sterilization 05/28/2013   Bacterial vaginosis 03/03/2013   Short cervix affecting pregnancy 03/03/2013   Insufficient prenatal care in second trimester 03/03/2013   Pyelonephritis complicating pregnancy in second trimester, antepartum 12/13/2012    Past Surgical History:  Procedure Laterality Date   LAPAROSCOPIC BILATERAL SALPINGECTOMY Bilateral 05/28/2013   Procedure: LAPAROSCOPIC BILATERAL SALPINGECTOMY;  Surgeon: Allie Bossier, MD;  Location: [facility];  Service: Gynecology;  Laterality: Bilateral;   NO PAST SURGERIES      OB History     Gravida  3   Para  3   Term  2   Preterm  1   AB      Living  3      SAB      IAB      Ectopic      Multiple      Live Births  3            Home Medications    Prior to Admission medications   Medication Sig Start Date End  Date Taking? Authorizing Provider  oseltamivir (TAMIFLU) 75 MG capsule Take 1 capsule (75 mg total) by mouth every 12 (twelve) hours. 05/21/22  Yes Radford Pax, NP  promethazine-dextromethorphan (PROMETHAZINE-DM) 6.25-15 MG/5ML syrup Take 5 mLs by mouth 4 (four) times daily as needed for cough. 05/21/22  Yes Radford Pax, NP  albuterol (VENTOLIN HFA) 108 (90 Base) MCG/ACT inhaler Inhale 1-2 puffs into the lungs every 6 (six) hours as needed for wheezing or shortness of breath. 02/20/22   Gustavus Bryant, FNP  benzonatate (TESSALON) 100 MG capsule Take 1 capsule (100 mg total) by mouth every 8 (eight) hours as needed for cough. 02/20/22   Gustavus Bryant, FNP  guaiFENesin (ROBITUSSIN) 100 MG/5ML liquid Take 400 mg by mouth every 4 (four) hours as needed for cough. Patient not taking: Reported on 08/08/2021    [provider]  lamoTRIgine (LAMICTAL) 25 MG tablet Take 25 mg by mouth 2 (two) times daily. Patient not taking: Reported on 08/08/2021    [provider]  mirtazapine (REMERON) 15 MG tablet Take 15 mg by mouth at bedtime. Patient not taking: Reported on 08/08/2021  [provider]  ondansetron (ZOFRAN-ODT) 8 MG disintegrating tablet Take 1 tablet (8 mg total) by mouth every 8 (eight) hours as needed for nausea or vomiting. 08/08/21   Merwyn Katos, MD    Family History Family History  Problem Relation Age of Onset   Diabetes Maternal Grandmother    Hypertension Paternal Grandmother    Diabetes Paternal Grandmother     Social History Social History   Tobacco Use   Smoking status: Former    Packs/day: 0.25    Types: Cigarettes    Quit date: 02/11/2015    Years since quitting: 7.2   Smokeless tobacco: Never  Substance Use Topics   Alcohol use: Yes    Comment: occasional wine cooler   Drug use: No     Allergies   Patient has no known allergies.   Review of Systems Review of Systems  Constitutional:  Positive for fever.  HENT:  Positive for  congestion and sore throat.   Respiratory:  Positive for cough.   Musculoskeletal:  Positive for myalgias.     Physical Exam Triage Vital Signs ED Triage Vitals [05/21/22 1326]  Enc Vitals Group     BP (!) 134/96     Pulse Rate 74     Resp 18     Temp 97.9 F (36.6 C)     Temp Source Oral     SpO2 96 %     Weight      Height      Head Circumference      Peak Flow      Pain Score 5     Pain Loc      Pain Edu?      Excl. in [place]?    No data found.  Updated Vital Signs BP (!) 134/96 (BP Location: Left Arm)   Pulse 74   Temp 97.9 F (36.6 C) (Oral)   Resp 18   SpO2 96%   Visual Acuity Right Eye Distance:   Left Eye Distance:   Bilateral Distance:    Right Eye Near:   Left Eye Near:    Bilateral Near:     Physical Exam Vitals and nursing note reviewed.  Constitutional:      General: She is not in acute distress.    Appearance: She is well-developed. She is not ill-appearing.  HENT:     Head: Normocephalic and atraumatic.     Right Ear: Tympanic membrane and ear canal normal.     Left Ear: Tympanic membrane and ear canal normal.     Nose: Congestion present.     Mouth/Throat:     Mouth: Mucous membranes are moist.     Pharynx: Oropharynx is clear. Uvula midline. Posterior oropharyngeal erythema present.     Tonsils: No tonsillar exudate or tonsillar abscesses.  Eyes:     Conjunctiva/sclera: Conjunctivae normal.     Pupils: Pupils are equal, round, and reactive to light.  Cardiovascular:     Rate and Rhythm: Normal rate and regular rhythm.     Heart sounds: Normal heart sounds.  Pulmonary:     Effort: Pulmonary effort is normal.     Breath sounds: Normal breath sounds. No wheezing.  Musculoskeletal:     Cervical back: Normal range of motion and neck supple.  Lymphadenopathy:     Cervical: No cervical adenopathy.  Skin:    General: Skin is warm and dry.  Neurological:     General: No focal deficit present.     Mental  Status: She is alert and oriented  to person, place, and time.  Psychiatric:        Mood and Affect: Mood normal.        Behavior: Behavior normal.      UC Treatments / Results  Labs (all labs ordered are listed, but only abnormal results are displayed) Labs Reviewed  SARS CORONAVIRUS 2 (TAT 6-24 HRS)    EKG   Radiology No results found.  Procedures Procedures (including critical care time)  Medications Ordered in UC Medications - No data to display  Initial Impression / Assessment and Plan / UC Course  I have reviewed the triage vital signs and the nursing notes.  Pertinent labs & imaging results that were available during my care of the patient were reviewed by me and considered in my medical decision making (see chart for details).     Reviewed exam and symptoms with patient. Flulike symptoms, start Tamiflu COVID PCR and will contact if positive Tessalon as needed for cough Rest and fluids PCP follow-up 2 to 3 days for recheck ER precautions reviewed and patient verbalized understanding Final Clinical Impressions(s) / UC Diagnoses   Final diagnoses:  Acute cough  Viral illness  Flu-like symptoms     Discharge Instructions      Tamiflu twice daily for 5 days Cough syrup as needed Continue over-the-counter Tylenol or ibuprofen as needed Rest and fluids Please follow-up with your PCP 2 to 3 days for recheck Please go the emergency room if you have any worsening symptoms   ED Prescriptions     Medication Sig Dispense Auth. Provider   oseltamivir (TAMIFLU) 75 MG capsule Take 1 capsule (75 mg total) by mouth every 12 (twelve) hours. 10 capsule Melynda Ripple, NP   promethazine-dextromethorphan (PROMETHAZINE-DM) 6.25-15 MG/5ML syrup Take 5 mLs by mouth 4 (four) times daily as needed for cough. 118 mL Melynda Ripple, NP      PDMP not reviewed this encounter.   Melynda Ripple, NP 05/21/22 1336

## 2022-05-21 NOTE — ED Triage Notes (Signed)
Pt here for cough and pain with cough x 2 days; pt sts some fever at times

## 2022-06-03 ENCOUNTER — Ambulatory Visit (INDEPENDENT_AMBULATORY_CARE_PROVIDER_SITE_OTHER): Payer: Self-pay

## 2022-06-03 ENCOUNTER — Ambulatory Visit
Admission: EM | Admit: 2022-06-03 | Discharge: 2022-06-03 | Disposition: A | Discharge status: Home / Self Care | Payer: Self-pay | Attending: Physician Assistant | Admitting: Physician Assistant

## 2022-06-03 DIAGNOSIS — R059 Cough, unspecified: Secondary | ICD-10-CM

## 2022-06-03 DIAGNOSIS — J1100 Influenza due to unidentified influenza virus with unspecified type of pneumonia: Secondary | ICD-10-CM

## 2022-06-03 MED ORDER — AMOXICILLIN-POT CLAVULANATE 875-125 MG PO TABS: 1.0000 | ORAL_TABLET | Freq: Two times a day (BID) | ORAL | 0 refills | Status: AC

## 2022-06-03 NOTE — ED Provider Notes (Signed)
[name]    CSN: [id number] Arrival date & time: 06/03/22  1535      History   Chief Complaint Chief Complaint  Patient presents with   Cough   Weakness   Chills    HPI Meghan Salazar is a 37 y.o. female.   Patient here today for evaluation of productive cough, colored sputum, and generalized weakness and chills that have persisted since being diagnosed with flu over a week ago. She denies any vomiting or diarrhea. She has tried tamiflu and OTC meds without resolution.   The history is provided by the patient.  Cough Associated symptoms: chills and fever   Associated symptoms: no ear pain, no eye discharge, no shortness of breath, no sore throat and no wheezing   Weakness Associated symptoms: cough and fever   Associated symptoms: no abdominal pain, no diarrhea, no nausea, no shortness of breath and no vomiting     Past Medical History:  Diagnosis Date   Abnormal Pap smear    biopsy   Anxiety    Asthma    Bipolar 1 disorder (HCC)    Infection    uti   Other and unspecified ovarian cysts    PTSD (post-traumatic stress disorder)     Patient Active Problem List   Diagnosis Date Noted   Consultation for female sterilization 05/28/2013   Bacterial vaginosis 03/03/2013   Short cervix affecting pregnancy 03/03/2013   Insufficient prenatal care in second trimester 03/03/2013   Pyelonephritis complicating pregnancy in second trimester, antepartum 12/13/2012    Past Surgical History:  Procedure Laterality Date   LAPAROSCOPIC BILATERAL SALPINGECTOMY Bilateral 05/28/2013   Procedure: LAPAROSCOPIC BILATERAL SALPINGECTOMY;  Surgeon: Allie Bossier, MD;  Location: [facility];  Service: Gynecology;  Laterality: Bilateral;   NO PAST SURGERIES      OB History     Gravida  3   Para  3   Term  2   Preterm  1   AB      Living  3      SAB      IAB      Ectopic      Multiple      Live Births  3            Home Medications    Prior to  Admission medications   Medication Sig Start Date End Date Taking? Authorizing Provider  amoxicillin-clavulanate (AUGMENTIN) 875-125 MG tablet Take 1 tablet by mouth every 12 (twelve) hours. 06/03/22  Yes Tomi Bamberger, PA-C  albuterol (VENTOLIN HFA) 108 (90 Base) MCG/ACT inhaler Inhale 1-2 puffs into the lungs every 6 (six) hours as needed for wheezing or shortness of breath. 02/20/22   Gustavus Bryant, FNP  benzonatate (TESSALON) 100 MG capsule Take 1 capsule (100 mg total) by mouth every 8 (eight) hours as needed for cough. 02/20/22   Gustavus Bryant, FNP  guaiFENesin (ROBITUSSIN) 100 MG/5ML liquid Take 400 mg by mouth every 4 (four) hours as needed for cough. Patient not taking: Reported on 08/08/2021    [provider]  lamoTRIgine (LAMICTAL) 25 MG tablet Take 25 mg by mouth 2 (two) times daily. Patient not taking: Reported on 08/08/2021    [provider]  mirtazapine (REMERON) 15 MG tablet Take 15 mg by mouth at bedtime. Patient not taking: Reported on 08/08/2021    [provider]  ondansetron (ZOFRAN-ODT) 8 MG disintegrating tablet Take 1 tablet (8 mg total) by mouth every 8 (eight) hours as  needed for nausea or vomiting. 08/08/21   Naaman Plummer, MD  oseltamivir (TAMIFLU) 75 MG capsule Take 1 capsule (75 mg total) by mouth every 12 (twelve) hours. 05/21/22   Melynda Ripple, NP  promethazine-dextromethorphan (PROMETHAZINE-DM) 6.25-15 MG/5ML syrup Take 5 mLs by mouth 4 (four) times daily as needed for cough. 05/21/22   Melynda Ripple, NP    Family History Family History  Problem Relation Age of Onset   Diabetes Maternal Grandmother    Hypertension Paternal Grandmother    Diabetes Paternal Grandmother     Social History Social History   Tobacco Use   Smoking status: Former    Packs/day: 0.25    Types: Cigarettes    Quit date: 02/11/2015    Years since quitting: 7.3   Smokeless tobacco: Never  Substance Use Topics   Alcohol use: Yes    Comment: occasional  wine cooler   Drug use: No     Allergies   Patient has no known allergies.   Review of Systems Review of Systems  Constitutional:  Positive for chills and fever.  HENT:  Positive for congestion. Negative for ear pain and sore throat.   Eyes:  Negative for discharge and redness.  Respiratory:  Positive for cough. Negative for shortness of breath and wheezing.   Gastrointestinal:  Negative for abdominal pain, diarrhea, nausea and vomiting.  Neurological:  Positive for weakness (generalized).     Physical Exam Triage Vital Signs ED Triage Vitals  Enc Vitals Group     BP 06/03/22 1550 (!) 136/104     Pulse Rate 06/03/22 1550 (!) 115     Resp 06/03/22 1550 18     Temp 06/03/22 1550 99.5 F (37.5 C)     Temp Source 06/03/22 1550 Oral     SpO2 06/03/22 1550 98 %     Weight --      Height --      Head Circumference --      Peak Flow --      Pain Score 06/03/22 1549 4     Pain Loc --      Pain Edu? --      Excl. in [place]? --    No data found.  Updated Vital Signs BP (!) 136/104 (BP Location: Left Arm)   Pulse (!) 115   Temp 99.5 F (37.5 C) (Oral)   Resp 18   SpO2 98%      Physical Exam Vitals and nursing note reviewed.  Constitutional:      General: She is not in acute distress.    Appearance: Normal appearance. She is not ill-appearing.  HENT:     Head: Normocephalic and atraumatic.     Nose: Congestion present.     Mouth/Throat:     Mouth: Mucous membranes are moist.     Pharynx: No oropharyngeal exudate or posterior oropharyngeal erythema.  Eyes:     Conjunctiva/sclera: Conjunctivae normal.  Cardiovascular:     Rate and Rhythm: Normal rate and regular rhythm.     Heart sounds: Normal heart sounds. No murmur heard. Pulmonary:     Effort: Pulmonary effort is normal. No respiratory distress.     Breath sounds: Rhonchi (diffuse) present. No wheezing or rales.  Skin:    General: Skin is warm and dry.  Neurological:     Mental Status: She is alert.   Psychiatric:        Mood and Affect: Mood normal.        Thought  Content: Thought content normal.      UC Treatments / Results  Labs (all labs ordered are listed, but only abnormal results are displayed) Labs Reviewed - No data to display  EKG   Radiology DG Chest 2 View  Result Date: 06/03/2022 CLINICAL DATA:  Cough.  Diagnosed with flu over week ago. EXAM: CHEST - 2 VIEW COMPARISON:  AP chest 08/12/2020 FINDINGS: Cardiac silhouette and mediastinal contours are within normal limits. The lungs are clear. No pleural effusion or pneumothorax. No acute skeletal abnormality. IMPRESSION: No active cardiopulmonary disease. Electronically Signed   By: Yvonne Kendall M.D.   On: 06/03/2022 16:10    Procedures Procedures (including critical care time)  Medications Ordered in UC Medications - No data to display  Initial Impression / Assessment and Plan / UC Course  I have reviewed the triage vital signs and the nursing notes.  Pertinent labs & imaging results that were available during my care of the patient were reviewed by me and considered in my medical decision making (see chart for details).    CXR without acute findings however given presentation and exam findings will treat to cover pneumonia. OK to continue symptomatic treatment as well. Encouraged follow up if no gradual improvement or with any further concerns.   Final Clinical Impressions(s) / UC Diagnoses   Final diagnoses:  Influenza with pneumonia   Discharge Instructions   None    ED Prescriptions     Medication Sig Dispense Auth. Provider   amoxicillin-clavulanate (AUGMENTIN) 875-125 MG tablet Take 1 tablet by mouth every 12 (twelve) hours. 14 tablet Francene Finders, PA-C      PDMP not reviewed this encounter.   Francene Finders, PA-C 06/03/22 [id number]

## 2022-06-03 NOTE — ED Triage Notes (Signed)
Pt presents with ongoing productive cough with colored mucous, chills and weakness after being diagnosed with flu over a week ago.

## 2023-08-19 ENCOUNTER — Encounter (HOSPITAL_COMMUNITY): Payer: Self-pay | Admitting: Emergency Medicine

## 2023-08-19 ENCOUNTER — Emergency Department (HOSPITAL_COMMUNITY): Payer: Self-pay

## 2023-08-19 ENCOUNTER — Emergency Department (HOSPITAL_COMMUNITY)
Admission: EM | Admit: 2023-08-19 | Discharge: 2023-08-19 | Disposition: A | Discharge status: Home / Self Care | Payer: Self-pay | Attending: Emergency Medicine | Admitting: Emergency Medicine

## 2023-08-19 DIAGNOSIS — R000 Tachycardia, unspecified: Secondary | ICD-10-CM | POA: Insufficient documentation

## 2023-08-19 DIAGNOSIS — R509 Fever, unspecified: Secondary | ICD-10-CM | POA: Insufficient documentation

## 2023-08-19 DIAGNOSIS — Z7951 Long term (current) use of inhaled steroids: Secondary | ICD-10-CM | POA: Insufficient documentation

## 2023-08-19 DIAGNOSIS — J4551 Severe persistent asthma with (acute) exacerbation: Secondary | ICD-10-CM

## 2023-08-19 LAB — CBC WITH DIFFERENTIAL/PLATELET
Abs Immature Granulocytes: 0.04 10*3/uL (ref 0.00–0.07)
Basophils Absolute: 0 10*3/uL (ref 0.0–0.1)
Basophils Relative: 0 %
Eosinophils Absolute: 0.4 10*3/uL (ref 0.0–0.5)
Eosinophils Relative: 4 %
HCT: 37.7 % (ref 36.0–46.0)
Hemoglobin: 13.7 g/dL (ref 12.0–15.0)
Immature Granulocytes: 0 %
Lymphocytes Relative: 15 %
Lymphs Abs: 1.6 10*3/uL (ref 0.7–4.0)
MCH: 32.2 pg (ref 26.0–34.0)
MCHC: 36.3 g/dL — ABNORMAL HIGH (ref 30.0–36.0)
MCV: 88.7 fL (ref 80.0–100.0)
Monocytes Absolute: 0.4 10*3/uL (ref 0.1–1.0)
Monocytes Relative: 3 %
Neutro Abs: 8 10*3/uL — ABNORMAL HIGH (ref 1.7–7.7)
Neutrophils Relative %: 78 %
Platelets: 158 10*3/uL (ref 150–400)
RBC: 4.25 MIL/uL (ref 3.87–5.11)
RDW: 12.9 % (ref 11.5–15.5)
WBC: 10.4 10*3/uL (ref 4.0–10.5)
nRBC: 0 % (ref 0.0–0.2)

## 2023-08-19 LAB — BASIC METABOLIC PANEL WITH GFR
Anion gap: 12 (ref 5–15)
BUN: 6 mg/dL (ref 6–20)
CO2: 18 mmol/L — ABNORMAL LOW (ref 22–32)
Calcium: 9.3 mg/dL (ref 8.9–10.3)
Chloride: 105 mmol/L (ref 98–111)
Creatinine, Ser: 1.02 mg/dL — ABNORMAL HIGH (ref 0.44–1.00)
GFR, Estimated: >60 mL/min (ref >60)
Glucose, Bld: 131 mg/dL — ABNORMAL HIGH (ref 70–99)
Potassium: 3.4 mmol/L — ABNORMAL LOW (ref 3.5–5.1)
Sodium: 135 mmol/L (ref 135–145)

## 2023-08-19 LAB — RESP PANEL BY RT-PCR (RSV, FLU A&B, COVID)  RVPGX2
Influenza A by PCR: NEGATIVE
Influenza B by PCR: NEGATIVE
Resp Syncytial Virus by PCR: NEGATIVE
SARS Coronavirus 2 by RT PCR: NEGATIVE

## 2023-08-19 MED ORDER — ALBUTEROL SULFATE (2.5 MG/3ML) 0.083% IN NEBU: 2.5000 mg | INHALATION_SOLUTION | Freq: Four times a day (QID) | RESPIRATORY_TRACT | 12 refills | Status: AC | PRN

## 2023-08-19 MED ORDER — PREDNISONE 50 MG PO TABS: 50.0000 mg | ORAL_TABLET | Freq: Every day | ORAL | 0 refills | Status: AC

## 2023-08-19 MED ORDER — IPRATROPIUM-ALBUTEROL 0.5-2.5 (3) MG/3ML IN SOLN
3.0000 mL | Freq: Once | RESPIRATORY_TRACT | Status: AC
  Administered 2023-08-19: 3 mL via RESPIRATORY_TRACT
  Filled 2023-08-19: qty 3

## 2023-08-19 MED ORDER — ALBUTEROL SULFATE (2.5 MG/3ML) 0.083% IN NEBU
7.5000 mg/h | INHALATION_SOLUTION | Freq: Once | RESPIRATORY_TRACT | Status: AC
Start: 2023-08-19 — End: 2023-08-19
  Administered 2023-08-19: 7.5 mg/h via RESPIRATORY_TRACT
  Filled 2023-08-19 (×2): qty 9

## 2023-08-19 NOTE — ED Notes (Addendum)
RT called for continuous neb 

## 2023-08-19 NOTE — ED Notes (Signed)
 ED Provider at bedside.

## 2023-08-19 NOTE — ED Notes (Signed)
Bedside Xray

## 2023-08-19 NOTE — ED Provider Notes (Signed)
 [facility] Provider Note   CSN: [id number] Arrival date & time: 08/19/23  1853    History  Chief Complaint  Patient presents with   Respiratory Distress    Meghan Salazar is a 38 y.o. female here for evaluation of wheezing and shortness of breath.  Has noted over the last 2 to 3 days she has had congestion, rhinorrhea, subjective fever cough productive of green sputum and wheezing.  She has a history of asthma has been admitted previously for same.  EMS was called out this morning gave her a nebulizer she felt improved and did not come to the emergency department.  When EMS arrived this evening she was not moving air.  EMS gave DuoNeb x 2, magnesium, Solu-Medrol with significant improvement.  She arrives here in the middle of her second nebulizer.  Patient states she feels improved her is not shaky.  No history of PE or DVT.  No prior intubations.  Denies chance of pregnancy.  Unknown sick contacts.  No hemoptysis.  No pain or swelling to lower extremities.  No recent surgery, immobilization malignancy.  Has some chest tightness but denies overt pain  HPI     Home Medications Prior to Admission medications   Medication Sig Start Date End Date Taking? Authorizing Provider  albuterol (PROVENTIL) (2.5 MG/3ML) 0.083% nebulizer solution Take 3 mLs (2.5 mg total) by nebulization every 6 (six) hours as needed for wheezing or shortness of breath. 08/19/23  Yes Tatsuya Okray A, PA-C  predniSONE (DELTASONE) 50 MG tablet Take 1 tablet (50 mg total) by mouth daily for 5 days. 08/19/23 08/24/23 Yes Shweta Aman A, PA-C  albuterol (VENTOLIN HFA) 108 (90 Base) MCG/ACT inhaler Inhale 1-2 puffs into the lungs every 6 (six) hours as needed for wheezing or shortness of breath. 02/20/22   Gustavus Bryant, FNP  amoxicillin-clavulanate (AUGMENTIN) 875-125 MG tablet Take 1 tablet by mouth every 12 (twelve) hours. 06/03/22   Tomi Bamberger, PA-C  benzonatate (TESSALON)  100 MG capsule Take 1 capsule (100 mg total) by mouth every 8 (eight) hours as needed for cough. 02/20/22   Gustavus Bryant, FNP  guaiFENesin (ROBITUSSIN) 100 MG/5ML liquid Take 400 mg by mouth every 4 (four) hours as needed for cough. Patient not taking: Reported on 08/08/2021    [provider]  lamoTRIgine (LAMICTAL) 25 MG tablet Take 25 mg by mouth 2 (two) times daily. Patient not taking: Reported on 08/08/2021    [provider]  mirtazapine (REMERON) 15 MG tablet Take 15 mg by mouth at bedtime. Patient not taking: Reported on 08/08/2021    [provider]  ondansetron (ZOFRAN-ODT) 8 MG disintegrating tablet Take 1 tablet (8 mg total) by mouth every 8 (eight) hours as needed for nausea or vomiting. 08/08/21   Merwyn Katos, MD  oseltamivir (TAMIFLU) 75 MG capsule Take 1 capsule (75 mg total) by mouth every 12 (twelve) hours. 05/21/22   Radford Pax, NP  promethazine-dextromethorphan (PROMETHAZINE-DM) 6.25-15 MG/5ML syrup Take 5 mLs by mouth 4 (four) times daily as needed for cough. 05/21/22   Radford Pax, NP      Allergies    Patient has no known allergies.    Review of Systems   Review of Systems  Constitutional:  Positive for chills.  HENT:  Positive for congestion, rhinorrhea and sinus pressure. Negative for sore throat.   Respiratory:  Positive for cough, chest tightness, shortness of breath and wheezing.   Cardiovascular:  Negative.   Gastrointestinal: Negative.   Genitourinary: Negative.   Musculoskeletal: Negative.   Neurological: Negative.   All other systems reviewed and are negative.   Physical Exam Updated Vital Signs BP (!) 153/96   Pulse 100   Temp 98 F (36.7 C)   Resp (!) 22   Ht 5\' 8"  (1.727 m)   Wt 90.7 kg   SpO2 100%   BMI 30.41 kg/m  Physical Exam Vitals and nursing note reviewed.  Constitutional:      General: She is in acute distress.     Appearance: She is well-developed. She is not ill-appearing, toxic-appearing or  diaphoretic.  HENT:     Head: Normocephalic and atraumatic.     Nose: Nose normal.     Mouth/Throat:     Mouth: Mucous membranes are moist.  Eyes:     Pupils: Pupils are equal, round, and reactive to light.  Cardiovascular:     Rate and Rhythm: Regular rhythm. Tachycardia present.  Pulmonary:     Effort: Pulmonary effort is normal. No respiratory distress.     Breath sounds: Wheezing present.     Comments: Diffuse wheezing throughout, speaks in short sentences. Abdominal:     General: Bowel sounds are normal. There is no distension.     Palpations: Abdomen is soft.     Tenderness: There is no abdominal tenderness. There is no right CVA tenderness, left CVA tenderness or guarding.  Musculoskeletal:        General: No swelling or tenderness. Normal range of motion.     Cervical back: Normal range of motion and neck supple.     Right lower leg: No edema.     Left lower leg: No edema.     Comments: No bony tenderness, compartments  Skin:    General: Skin is warm and dry.     Capillary Refill: Capillary refill takes less than 2 seconds.     Comments: No obvious rashes or lesions to exposed skin  Neurological:     General: No focal deficit present.     Mental Status: She is alert and oriented to person, place, and time.     ED Results / Procedures / Treatments   Labs (all labs ordered are listed, but only abnormal results are displayed) Labs Reviewed  CBC WITH DIFFERENTIAL/PLATELET - Abnormal; Notable for the following components:      Result Value   MCHC 36.3 (*)    Neutro Abs 8.0 (*)    All other components within normal limits  BASIC METABOLIC PANEL WITH GFR - Abnormal; Notable for the following components:   Potassium 3.4 (*)    CO2 18 (*)    Glucose, Bld 131 (*)    Creatinine, Ser 1.02 (*)    All other components within normal limits  RESP PANEL BY RT-PCR (RSV, FLU A&B, COVID)  [id number]    EKG None  Radiology DG Chest Portable 1 View Result Date:  08/19/2023 CLINICAL DATA:  Cough, dyspnea EXAM: PORTABLE CHEST 1 VIEW COMPARISON:  None Available. FINDINGS: The heart size and mediastinal contours are within normal limits. Both lungs are clear. The visualized skeletal structures are unremarkable. IMPRESSION: No active disease. Electronically Signed   By: Helyn Numbers M.D.   On: 08/19/2023 19:57    Procedures .Critical Care  Performed by: Linwood Dibbles, PA-C Authorized by: Linwood Dibbles, PA-C   Critical care provider statement:    Critical care time (minutes):  35   Critical care was necessary  to treat or prevent imminent or life-threatening deterioration of the following conditions:  Respiratory failure   Critical care was time spent personally by me on the following activities:  Development of treatment plan with patient or surrogate, discussions with consultants, evaluation of patient's response to treatment, examination of patient, ordering and review of laboratory studies, ordering and review of radiographic studies, ordering and performing treatments and interventions, pulse oximetry, re-evaluation of patient's condition and review of old charts     Medications Ordered in ED Medications  ipratropium-albuterol (DUONEB) 0.5-2.5 (3) MG/3ML nebulizer solution 3 mL (3 mLs Nebulization Given 08/19/23 1929)  albuterol (PROVENTIL) (2.5 MG/3ML) 0.083% nebulizer solution (7.5 mg/hr Nebulization Given 08/19/23 2141)   ED Course/ Medical Decision Making/ A&P   38 year old here for evaluation of wheeze.  Sounds like she has had a few days of URI symptoms.  No known sick contacts.  EMS called out to house earlier this morning gave nebulizer which improved.  EMS called out back again tonight with respiratory distress, not moving any air.  Was given DuoNeb x 2, Solu-Medrol and magnesium.  On arrival she is tachycardic, tachypneic, shaky however I suspect this is likely due to her albuterol treatments.  She has no clinical evidence of VTE on  exam.  Labs and imaging personally reviewed and interpreted:  CBC without leukocytosis Metabolic panel potassium 3.4, creatinine 1.02 Viral panel negative Chest x-ray without significant abnormality EKG without ischemic changes  Patient reassessed. Wheezing improved however still with moderate wheeze. Discussed admission given she has had 3 duonebs, mag and solumedrol however she does not want admission at this time. Will trial continuous neb and reassess.  Patient reassessed after continuous nebulizer.  Still sounds wheezy. No hypoxia. Does have some tachycardia which I suspect is from her multiple nebs.   Patient reassessed.  Feels better after continuous nebulizer.  Still has some wheeze.  She wants to go home.  Will start steroids.  She does not have a nebulizer machine I wrote for this as well.  We again discussed admission which patient declined.  She voiced understanding of risk versus benefit.  Will have her return if she changes her mind or symptoms worsen.  Low suspicion for bacterial infectious process, PE, dissection, pneumothorax, ACS, unstable angina, traumatic fracture.                                Medical Decision Making Amount and/or Complexity of Data Reviewed Independent Historian: EMS External Data Reviewed: labs, radiology, ECG and notes. Labs: ordered. Decision-making details documented in ED Course. Radiology: ordered and independent interpretation performed. Decision-making details documented in ED Course. ECG/medicine tests: ordered and independent interpretation performed. Decision-making details documented in ED Course.  Risk OTC drugs. Prescription drug management. Parenteral controlled substances. Decision regarding hospitalization. Diagnosis or treatment significantly limited by social determinants of health.         Final Clinical Impression(s) / ED Diagnoses Final diagnoses:  Severe persistent asthma with acute exacerbation    Rx / DC  Orders ED Discharge Orders          Ordered    albuterol (PROVENTIL) (2.5 MG/3ML) 0.083% nebulizer solution  Every 6 hours PRN        08/19/23 2259    For home use only DME Nebulizer machine        08/19/23 2259    predniSONE (DELTASONE) 50 MG tablet  Daily  08/19/23 2259              Sarh Kirschenbaum A, PA-C 08/19/23 2316    Wynetta Fines, MD 08/20/23 0002

## 2023-08-19 NOTE — Discharge Instructions (Signed)
 It was a pleasure taking care of you here in the emergency department  We discussed admission however you did not want to be admitted.  I have written you for a nebulizer you may take just to the medical supply store to get the machine.  I have also written you for the solution for the machine  Start on the steroids tomorrow morning  In the meantime do 2 to 4 puffs of your albuterol inhaler every 4-6 hours as needed for shortness of breath  Make sure to follow-up outpatient, return for any worsening symptoms

## 2023-08-19 NOTE — ED Triage Notes (Signed)
 Pt arrives via EMS with reports of resp virus symptoms for a few days, including chest congestion and cough . EMS reports absent lung sounds, given 2 duonebs, 1 albuterol, 2g mag and 125 mg solu-medrol. Hx of asthma but does not normally need inhaler.

## 2023-08-20 ENCOUNTER — Telehealth: Payer: Self-pay

## 2023-08-20 ENCOUNTER — Telehealth (HOSPITAL_COMMUNITY): Payer: Self-pay | Admitting: Physician Assistant

## 2023-08-20 NOTE — Telephone Encounter (Cosign Needed)
 I saw patient yesterday in the ED. We had discussed admission for asthma exacerbation however she wanted to dc home. [facility] called and patient had come into the pharmacy SOB requesting an albuterol inhaler. Discussed with pharmacy verbal orders for albuterol inhaler placed. She had apparently left prior to me talking to pharmacy. Attempted to contact patient however no answer on number in EPIC.

## 2023-08-20 NOTE — Telephone Encounter (Signed)
 Patient called hse picked up nebulizer treatments, but she sdoes not have a nebulizer. Order viewed ( no TOC consult) called Mittie Bodo He will add to driver so she will get her nebulizer ASAP Discussed return percuations

## 2024-02-20 ENCOUNTER — Ambulatory Visit
Admission: EM | Admit: 2024-02-20 | Discharge: 2024-02-20 | Disposition: A | Discharge status: Home / Self Care | Attending: Emergency Medicine | Admitting: Emergency Medicine

## 2024-02-20 ENCOUNTER — Encounter: Payer: Self-pay | Admitting: Emergency Medicine

## 2024-02-20 DIAGNOSIS — N10 Acute tubulo-interstitial nephritis: Secondary | ICD-10-CM | POA: Diagnosis present

## 2024-02-20 LAB — POCT URINE DIPSTICK
Bilirubin, UA: NEGATIVE
Glucose, UA: NEGATIVE mg/dL
Ketones, POC UA: NEGATIVE mg/dL
Nitrite, UA: NEGATIVE
POC PROTEIN,UA: 100 — AB
Spec Grav, UA: >=1.03 — AB (ref 1.010–1.025)
Urobilinogen, UA: 0.2 U/dL
pH, UA: 5.5 (ref 5.0–8.0)

## 2024-02-20 LAB — POCT URINE PREGNANCY: Preg Test, Ur: NEGATIVE

## 2024-02-20 MED ORDER — IBUPROFEN 800 MG PO TABS
800.0000 mg | ORAL_TABLET | Freq: Once | ORAL | Status: AC
  Administered 2024-02-20: 800 mg via ORAL

## 2024-02-20 MED ORDER — SULFAMETHOXAZOLE-TRIMETHOPRIM 800-160 MG PO TABS
1.0000 | ORAL_TABLET | Freq: Two times a day (BID) | ORAL | 0 refills | Status: AC
Start: 2024-02-20 — End: 2024-02-27

## 2024-02-20 MED ORDER — FLUCONAZOLE 150 MG PO TABS: ORAL_TABLET | ORAL | 0 refills | Status: AC

## 2024-02-20 NOTE — ED Triage Notes (Signed)
 Pt c/o urinary frequency, pain when urinating, and lower abdominal pain for 3 days. States she changed soaps recently.

## 2024-02-20 NOTE — ED Provider Notes (Signed)
 [facility]    CSN: [id number] Arrival date & time: 02/20/24  1711      History   Chief Complaint Chief Complaint  Patient presents with   Urinary Frequency    HPI Meghan Salazar is a 38 y.o. female.   Patient presents to clinic for frequency, urgency and lower abdominal discomfort/suprapubic pain for the past 3 days.  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   History of UTIs, usually from intercourse. Denies nausea or emesis, denies vomiting or fevers.   Has been taking azo that provides some relief.  The history is provided by the patient and medical records.  Urinary Frequency    Past Medical History:  Diagnosis Date   Abnormal Pap smear    biopsy   Anxiety    Asthma    Bipolar 1 disorder (HCC)    Infection    uti   Other and unspecified ovarian cysts    PTSD (post-traumatic stress disorder)     Patient Active Problem List   Diagnosis Date Noted   Consultation for female sterilization 05/28/2013   Bacterial vaginosis 03/03/2013   Short cervix affecting pregnancy 03/03/2013   Insufficient prenatal care in second trimester 03/03/2013   Pyelonephritis affecting pregnancy in second trimester 12/13/2012    Past Surgical History:  Procedure Laterality Date   LAPAROSCOPIC BILATERAL SALPINGECTOMY Bilateral 05/28/2013   Procedure: LAPAROSCOPIC BILATERAL SALPINGECTOMY;  Surgeon: Harland JAYSON Birkenhead, MD;  Location: [facility];  Service: Gynecology;  Laterality: Bilateral;   NO PAST SURGERIES      OB History     Gravida  3   Para  3   Term  2   Preterm  1   AB      Living  3      SAB      IAB      Ectopic      Multiple      Live Births  3            Home Medications    Prior to Admission medications   Medication Sig Start Date End Date Taking? Authorizing Provider  fluconazole (DIFLUCAN) 150 MG tablet Take 1 tablet today and another in 72 hours if vaginal itching persist. 02/20/24  Yes Dreama, Rody Keadle   N, FNP  sulfamethoxazole -trimethoprim  (BACTRIM  DS) 800-160 MG tablet Take 1 tablet by mouth 2 (two) times daily for 7 days. 02/20/24 02/27/24 Yes Mikhala Kenan  N, FNP  albuterol  (PROVENTIL ) (2.5 MG/3ML) 0.083% nebulizer solution Take 3 mLs (2.5 mg total) by nebulization every 6 (six) hours as needed for wheezing or shortness of breath. 08/19/23   Henderly, Britni A, PA-C  albuterol  (VENTOLIN  HFA) 108 (90 Base) MCG/ACT inhaler Inhale 1-2 puffs into the lungs every 6 (six) hours as needed for wheezing or shortness of breath. 02/20/22   Hazen Darryle BRAVO, FNP  amoxicillin -clavulanate (AUGMENTIN ) 875-125 MG tablet Take 1 tablet by mouth every 12 (twelve) hours. 06/03/22   Billy Asberry FALCON, PA-C  benzonatate  (TESSALON ) 100 MG capsule Take 1 capsule (100 mg total) by mouth every 8 (eight) hours as needed for cough. 02/20/22   Hazen Darryle BRAVO, FNP  fluticasone  (FLONASE ) 50 MCG/ACT nasal spray Place 1 spray into the nose. 02/05/24 02/04/25  [provider]  guaiFENesin  (ROBITUSSIN) 100 MG/5ML liquid Take 400 mg by mouth every 4 (four) hours as needed for cough. Patient not taking: Reported on 08/08/2021    [provider]  lamoTRIgine (LAMICTAL) 25 MG tablet Take 25  mg by mouth 2 (two) times daily. Patient not taking: Reported on 08/08/2021    [provider]  mirtazapine (REMERON) 15 MG tablet Take 15 mg by mouth at bedtime. Patient not taking: Reported on 08/08/2021    [provider]  ondansetron  (ZOFRAN -ODT) 8 MG disintegrating tablet Take 1 tablet (8 mg total) by mouth every 8 (eight) hours as needed for nausea or vomiting. 08/08/21   Bradler, Evan K, MD  oseltamivir  (TAMIFLU ) 75 MG capsule Take 1 capsule (75 mg total) by mouth every 12 (twelve) hours. 05/21/22   Mayer, Jodi R, NP  promethazine -dextromethorphan (PROMETHAZINE -DM) 6.25-15 MG/5ML syrup Take 5 mLs by mouth 4 (four) times daily as needed for cough. 05/21/22   Loreda Myla SAUNDERS, NP    Family History Family History  Problem  Relation Age of Onset   Diabetes Maternal Grandmother    Hypertension Paternal Grandmother    Diabetes Paternal Grandmother     Social History Social History   Tobacco Use   Smoking status: Former    Current packs/day: 0.00    Types: Cigarettes    Quit date: 02/11/2015    Years since quitting: 9.0   Smokeless tobacco: Never  Substance Use Topics   Alcohol use: Yes    Comment: occasional wine cooler   Drug use: No     Allergies   Pollen extract   Review of Systems Review of Systems  Per HPI  Physical Exam Triage Vital Signs ED Triage Vitals [02/20/24 1728]  Encounter Vitals Group     BP 139/86     Girls Systolic BP Percentile      Girls Diastolic BP Percentile      Boys Systolic BP Percentile      Boys Diastolic BP Percentile      Pulse Rate 68     Resp 17     Temp 98.1 F (36.7 C)     Temp Source Oral     SpO2 98 %     Weight      Height      Head Circumference      Peak Flow      Pain Score 10     Pain Loc      Pain Education      Exclude from Growth Chart    No data found.  Updated Vital Signs BP 139/86 (BP Location: Right Arm)   Pulse 68   Temp 98.1 F (36.7 C) (Oral)   Resp 17   SpO2 98%   Visual Acuity Right Eye Distance:   Left Eye Distance:   Bilateral Distance:    Right Eye Near:   Left Eye Near:    Bilateral Near:     Physical Exam Vitals and nursing note reviewed.  Constitutional:      Appearance: Normal appearance.  HENT:     Head: Normocephalic and atraumatic.     Right Ear: External ear normal.     Left Ear: External ear normal.     Nose: Nose normal.     Mouth/Throat:     Mouth: Mucous membranes are moist.  Eyes:     Conjunctiva/sclera: Conjunctivae normal.  Cardiovascular:     Rate and Rhythm: Normal rate.  Pulmonary:     Effort: Pulmonary effort is normal. No respiratory distress.  Abdominal:     Tenderness: There is right CVA tenderness. There is no left CVA tenderness.  Skin:    General: Skin is warm and  dry.  Neurological:  General: No focal deficit present.     Mental Status: She is alert and oriented to person, place, and time.  Psychiatric:        Mood and Affect: Mood normal.        Behavior: Behavior normal.      UC Treatments / Results  Labs (all labs ordered are listed, but only abnormal results are displayed) Labs Reviewed  POCT URINE DIPSTICK - Abnormal; Notable for the following components:      Result Value   Spec Grav, UA >=1.030 (*)    Blood, UA small (*)    POC PROTEIN,UA =100 (*)    Leukocytes, UA Small (1+) (*)    All other components within normal limits  URINE CULTURE  POCT URINE PREGNANCY    EKG   Radiology No results found.  Procedures Procedures (including critical care time)  Medications Ordered in UC Medications  ibuprofen  (ADVIL ) tablet 800 mg (800 mg Oral Given 02/20/24 1734)    Initial Impression / Assessment and Plan / UC Course  I have reviewed the triage vital signs and the nursing notes.  Pertinent labs & imaging results that were available during my care of the patient were reviewed by me and considered in my medical decision making (see chart for details).  Vitals and triage reviewed, patient is hemodynamically stable.  Afebrile without tachycardia.  UA does have small red blood cells, protein and leukocytes.  Positive right-sided CVA tenderness.  Concern for pyelonephritis, will treat with Bactrim  twice daily for 7 days.  Urine sent for culture.  Will contact if treatment modification is needed.  Plan of care, follow-up care return precautions given, no questions at this time.  Pain management discussed.     Final Clinical Impressions(s) / UC Diagnoses   Final diagnoses:  Acute pyelonephritis     Discharge Instructions      Take the Bactrim  twice daily with food for the next 7 days.  You can take the Diflucan as needed if you develop vaginal itching, this will help prevent yeast vaginitis.  Ensure you are drinking at  least 64 ounces of water daily to help flush the kidneys.  You can continue to take Azo as needed.  Staff will contact if we need to modify treatment based on urine culture.  Return to clinic for new or urgent symptoms.    ED Prescriptions     Medication Sig Dispense Auth. Provider   sulfamethoxazole -trimethoprim  (BACTRIM  DS) 800-160 MG tablet Take 1 tablet by mouth 2 (two) times daily for 7 days. 14 tablet Dreama, Dyasia Firestine  N, FNP   fluconazole (DIFLUCAN) 150 MG tablet Take 1 tablet today and another in 72 hours if vaginal itching persist. 2 tablet Dreama, Lashawn Orrego  N, FNP      PDMP not reviewed this encounter.   Dreama, Geral Tuch  N, FNP 02/20/24 1755

## 2024-02-20 NOTE — Discharge Instructions (Signed)
 Take the Bactrim  twice daily with food for the next 7 days.  You can take the Diflucan as needed if you develop vaginal itching, this will help prevent yeast vaginitis.  Ensure you are drinking at least 64 ounces of water daily to help flush the kidneys.  You can continue to take Azo as needed.  Staff will contact if we need to modify treatment based on urine culture.  Return to clinic for new or urgent symptoms.

## 2024-02-21 LAB — URINE CULTURE: Culture: <10000 — AB

## 2024-02-22 ENCOUNTER — Ambulatory Visit (HOSPITAL_COMMUNITY): Payer: Self-pay
